# Patient Record
Sex: Male | Born: 1970 | Race: White | Hispanic: No | Marital: Married | Smoking: Never smoker
Health system: Southern US, Community
[De-identification: ages and names within clinical notes are randomized; demographics above are authoritative.]

## PROBLEM LIST (undated history)

## (undated) HISTORY — PX: WISDOM TOOTH EXTRACTION: SHX21

---

## 2015-03-01 ENCOUNTER — Encounter: Payer: Self-pay | Admitting: Family Medicine

## 2015-03-01 ENCOUNTER — Ambulatory Visit
Admission: RE | Admit: 2015-03-01 | Discharge: 2015-03-01 | Disposition: A | Discharge status: Home / Self Care | Payer: BC Managed Care – PPO | Source: Ambulatory Visit | Attending: Family Medicine | Admitting: Family Medicine

## 2015-03-01 ENCOUNTER — Ambulatory Visit (INDEPENDENT_AMBULATORY_CARE_PROVIDER_SITE_OTHER): Payer: BC Managed Care – PPO | Admitting: Family Medicine

## 2015-03-01 VITALS — BP 129/85 | HR 51 | Ht 72.0 in | Wt 177.0 lb

## 2015-03-01 DIAGNOSIS — R1030 Lower abdominal pain, unspecified: Secondary | ICD-10-CM | POA: Diagnosis not present

## 2015-03-01 DIAGNOSIS — R1032 Left lower quadrant pain: Secondary | ICD-10-CM

## 2015-03-01 DIAGNOSIS — R29898 Other symptoms and signs involving the musculoskeletal system: Secondary | ICD-10-CM

## 2015-03-01 NOTE — Progress Notes (Signed)
Jack Huynh - 45 y.o. 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 He states he ran 1700-1800 miles in the last year.  He noticed left groin pain during the [facility] in September. The pain now radiates from his groin, specifically medially extending to the ischial tuberosity, distally to his anterio medial quad. No bruising or swelling.   He had just changed to [facility] prior to the injury.  Since then he has tried various treatment modalities with minimal improvement in his pain. He has had acupuncture by PT, personally rolling out various muscle groups, and various adjustments by his chiropractor, who believes he has L4 radiculopathy.  [place] in [place] interim before taking the last 5 weeks off (other than 2x/week elliptical and spin, neither of which caused much pain).  He is medically healthy with no significant LE injuries in the past.  No systemic symptoms. No bowel/bladder dysfunction. LLE weakness. NO pain radiating past the anteriomedial thigh.   ROS:    As above, including no fevers, chills, night sweats, weight loss. No visible joint swelling or skin rashes.   HISTORY: Past Medical, Surgical, Social, and Family History Reviewed & Updated per EMR.  Pertinent Historical Findings include: None. Never smoker.  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   Outside XR from Chiropracter from 12/12/2014 shows Grade I/II anterolisthsis of L5/S1.  Otherwise mild arthritic change.   OBJECTIVE: BP 129/85 mmHg  Pulse 51  Ht 6' (1.829 m)  Wt 177 lb (80.287 kg)  BMI 24.00 kg/m2  Physical Exam  Calm, NAD Non-labored breathing No rash  Back/pelvis/LLE Exam: Inspection: unremarkable Motion: full SLR seated:  neg                 SLR lying: neg Seated HS Flexibility:  poor Palpable tenderness: along adductors/hamstrings, especially the ischial tuberosity. NO greater troch or SI tenderness. NO lumbar spinous process tenderness.  FABER: negative Negative piriformis stretch Sensory change: none Reflex change: none Strength at foot Plantar-flexion: 5 / 5    Dorsi-flexion: 5 / 5    Eversion: 5 / 5   Inversion: 5 / 5 Leg strength Quad:  4+ on the left compared to the right 5/ 5   Hamstring: 4+ on the left compared to the right 5/ 5    Hip flexor: 4+ on the left compared to the right 5/ 5   Hip abductors:4+ on the left compared to the right 5/ 5. Pain with resisted abd and adduction located primarily in the groin.  Gait Walking:  Non-antalgic       Heels: wnl     Toes:   wnl       Negative hop test on the left  Negative fulcrum test on the left   U/s: long and short axis views of the posteriomedial hip were obtained.  There does appear to be slightly increased hypoechoic change on the left vs. the right in addition to mild increased vascularity.   MEDICATIONS, LABS & OTHER ORDERS: Previous Medications   No medications on file   Modified Medications   No medications on file   New Prescriptions   No medications on file   Discontinued Medications   No medications on file  No orders of the defined types were placed  in this encounter.   ASSESSMENT & PLAN: LLE weakness and pain x 4 months non responsive to rest, multiple treatment modalities.  No red flags.  Multiple muscle groups appear involved likely compensating for primary injury.  Most concerned about stress fracture, Pelvic more likely than femoral neck, or avulsion injury. Will get XRs 1st.  If negative we may need to extend evaluation to the lumbar spine.  Call with any questions and we will discuss f/u after MRI.  Advised against working out until we know the cause due to risk of severe traumatic injury.   40 minutes of face to face time, more than half of which was spent discussing various  diagnoses and potential recovery time.

## 2015-03-18 ENCOUNTER — Telehealth: Payer: Self-pay | Admitting: Family Medicine

## 2015-03-18 DIAGNOSIS — M7918 Myalgia, other site: Secondary | ICD-10-CM

## 2015-03-18 NOTE — Telephone Encounter (Signed)
Pt was told to call her to leave a msg for Dr neal:  Didn't do MRI that was scheduled 2 weeks ago. Would like to talk to dr neal before rescheduling MRI.

## 2015-03-19 NOTE — Telephone Encounter (Signed)
Faxed office notes and PT Rx to: Charlsie Merles, [facility] Fax: [phone] Phone: [phone]

## 2015-03-19 NOTE — Telephone Encounter (Signed)
Will forward to Dr. Neal. Letesha Klecker,CMA  

## 2015-03-19 NOTE — Telephone Encounter (Signed)
[phone] (H) Called pt--voice mail was "unable to process my call at this time" I will try to call back later Jack Huynh

## 2015-03-19 NOTE — Telephone Encounter (Signed)
Spoke with patient at length regarding his issues and the next step in evaluation. The MRIs probably going to cost him about $1200. We discussed and he decided to give an evaluation by a friend of his was also a physical therapist and a runner. I will send another referral and send a copy of my note to Charlsie Merles in [place]. After evaluation, Jack Huynh will call me and let me know where we stand. I think this is a very reasonable approach.

## 2015-03-19 NOTE — Telephone Encounter (Signed)
Neeton This is for you at [facility]  Tia This referral is NOT for [facility]

## 2015-04-19 ENCOUNTER — Telehealth: Payer: Self-pay | Admitting: *Deleted

## 2015-04-19 NOTE — Telephone Encounter (Signed)
-----   Message from Lizbeth BarkMelanie L Ceresi sent at 04/18/2015  9:36 AM EDT ----- Regarding: phone message Contact: [phone] Pt called stating she wants to talk to Dr. Jennette KettleNeal regarding his next step of plan.

## 2015-04-19 NOTE — Telephone Encounter (Signed)
Let me know what you suggest for this patient, should we just make office visit to discuss?

## 2015-04-23 NOTE — Telephone Encounter (Signed)
Spoke to the patient and he would prefer a callback from you to go over his concerns from PT and if he would need an MRI.  Since he lives in [place] he said a phonecall is much easier than a visit. Make sure to dial his areacode since its a long distance number

## 2015-04-23 NOTE — Telephone Encounter (Signed)
Rhea Yes see if we can get him in to see me or one of the fellows---I cannot remember which one he saw with me last time---maybe Harrison MonsBlake? I think it is going to be too complicated over phone but of course if he WANTS me to call and try I can THANKS! Denny LevySara Neal

## 2015-04-24 ENCOUNTER — Telehealth: Payer: Self-pay | Admitting: Family Medicine

## 2015-04-24 NOTE — Telephone Encounter (Signed)
[phone] (H) Left mssg I wil try to call him back later todayor tomorrow AM Denny LevySara Haliegh Khurana

## 2015-04-25 NOTE — Telephone Encounter (Signed)
Long discussion regarding his pain. He is now talking about some back pain that sometimes radiates down his thigh. Even after asking him several times and not quite sure if it radiates down the posterior part oriented part. He also still has the groin pain. He is only run twice since we saw him. He has been going to physical therapist. The physical therapist told him he thought he may have an L4 nerve problem. He has been reading some stuff on the Internet and is wondering if he has a sports hernia or "muscle splintering" which I think is probably a muscle strain. I'm not sure of that definition.  We talked for a very long time. [place] works in [place] so coming back in for an appointment is a bit difficult but he finally agreed to do that so we can get a better handle on this. I'm going to ask him to be seen by Dr. Darrick PennaFields. We will call him to make that appointment. He will be out of town the next few days with return next Tuesday night so he would be available for an appointment next Wednesday the 29th.

## 2015-05-08 ENCOUNTER — Ambulatory Visit: Payer: BC Managed Care – PPO | Admitting: Sports Medicine

## 2015-05-22 ENCOUNTER — Ambulatory Visit
Admission: RE | Admit: 2015-05-22 | Discharge: 2015-05-22 | Disposition: A | Discharge status: Home / Self Care | Payer: BC Managed Care – PPO | Source: Ambulatory Visit | Attending: Sports Medicine | Admitting: Sports Medicine

## 2015-05-22 ENCOUNTER — Ambulatory Visit (INDEPENDENT_AMBULATORY_CARE_PROVIDER_SITE_OTHER): Payer: BC Managed Care – PPO | Admitting: Sports Medicine

## 2015-05-22 ENCOUNTER — Encounter: Payer: Self-pay | Admitting: Sports Medicine

## 2015-05-22 ENCOUNTER — Other Ambulatory Visit: Payer: BC Managed Care – PPO

## 2015-05-22 VITALS — BP 120/70 | Ht 72.0 in | Wt 177.0 lb

## 2015-05-22 DIAGNOSIS — R1030 Lower abdominal pain, unspecified: Secondary | ICD-10-CM | POA: Diagnosis not present

## 2015-05-22 DIAGNOSIS — R29898 Other symptoms and signs involving the musculoskeletal system: Secondary | ICD-10-CM

## 2015-05-22 DIAGNOSIS — M545 Low back pain, unspecified: Secondary | ICD-10-CM

## 2015-05-22 DIAGNOSIS — R1032 Left lower quadrant pain: Secondary | ICD-10-CM

## 2015-05-22 DIAGNOSIS — G8929 Other chronic pain: Secondary | ICD-10-CM

## 2015-05-22 NOTE — Progress Notes (Signed)
Patient ID: Jack Huynh, male   DOB: April 08, 1970, 45 y.o.   MRN: [id number]  CC: persistent left groin pain and left leg weakness  Last year the patient ran more intensely than before He completed marathons in March April and November He also did the [employer] relay race in October By the first week in October just after the relay race he was having some groin pain He continued training with long runs up to 18 miles and the groin pain persisted  In November he completed the [employer] [place] in 3 hours and 29 minutes He felt like he ran about 10 minutes slower because of his left thigh seeming weaker  Since first the year he has tried running 4-5 times but consistently gets pain  He has seen a chiropractor and has done some physical therapy to try to improve strength around the left hip This has not been helpful Chiropractor was concerned that he might have some L4 radiculopathy  He was seen in our office with concern about a femoral neck stress fracture Regular x-rays were unremarkable He declined to get the MRI of the hip as he felt that this did not seem primarily in the hip  Social history: Surveyor, quantitysuperintendent of schools in [place] Non smoker  ROS He has pain and sometimes some spasm over his lower abdomen above the symphysis pubis He has pain with cough and sneezing but this is located at the symphysis No bulging in either the testicle or groin area  Physical examination Muscular white male in no acute distress BP 120/70 mmHg  Ht 6' (1.829 m)  Wt 177 lb (80.287 kg)  BMI 24.00 kg/m2  Full hip range of motion without any pain normal SI joint motion Tenderness to palpation and percussion over the symphysis pubis No bulging noted in the femoral triangle or lower abdomen with Valsalva  There is atrophy of the left measuring 2 cm smaller than the right The left calf this three-quarter centimeter smaller than the right  Strength of hip flexors, hip abductors,  quadriceps and hamstrings are all normal Hip adductor on the left is weak and painful to testing  Neurologic signs - only positive noted is that he has slightly diminished reflex at the left knee He can do heel, toe and tandem walk Straight leg raise is negative  Review of x-rays - left hip films look entirely normal  Review of symphysis on x-ray shows sclerosis on the left, some widening at the inferior symphysis and some possible cystic change

## 2015-05-22 NOTE — Assessment & Plan Note (Addendum)
Her we'll work hip motion  If MRI does not suggest another cause will lead stretches and other standard treatment for osteitis pubis  I spent 45 minutes face-to-face evaluated in reviewing all of this patient's data. Greater 50% of this was spent counseling regarding diagnostic possibilities and possible treatments.

## 2015-05-22 NOTE — Assessment & Plan Note (Signed)
I gave him a series of exercises to emphasize his abductor and the other muscles around the hip girdle  We will continue with these and he'll get more information from his MRI  If we think he has lumbar disease we may need to add a medication like gabapentin

## 2015-05-27 ENCOUNTER — Ambulatory Visit (INDEPENDENT_AMBULATORY_CARE_PROVIDER_SITE_OTHER): Payer: BC Managed Care – PPO | Admitting: Sports Medicine

## 2015-05-27 ENCOUNTER — Encounter: Payer: Self-pay | Admitting: Sports Medicine

## 2015-05-27 VITALS — Ht 72.0 in | Wt 177.0 lb

## 2015-05-27 DIAGNOSIS — M4316 Spondylolisthesis, lumbar region: Secondary | ICD-10-CM

## 2015-05-28 NOTE — Progress Notes (Signed)
Patient ID: Jack Huynh, male   DOB: [birth year], 45 y.o.   MRN: [id number]  Patient comes in today to discuss MRI findings of his lumbar spine. I see him in lieu of Dr Darrick PennaFields' absence. The MRI shows single level pathology at L4-L5. 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 Remainder of his lumbar spine is fairly unremarkable. We feel like this finding is responsible for the patient's left leg weakness and pain. I've discussed this case thoroughly with Dr.Fields. We would like for the patient to start with several Williams flexion home exercises. Patient would also like to work with a physical therapist in [place] so I will arrange for that as well. I had a long talk with him regarding his diagnosis, prognosis, and activity level. He is instructed to avoid any and all activities that involve lumbar hyperextension. We also think that he needs to avoid running at least until follow-up. He will follow-up with Dr. Darrick PennaFields again in 6 weeks.  Total time spent with this patient was 30 minutes with greater than 50% of the time spent in face-to-face consultation discussing his diagnosis, reviewing his MRI, and answering questions about level of activity allowed and prognosis.  Addendum: At the time of his office visit, the patient was asking about a possible "nerve block". He stated that Dr.Fields had mentioned this to him. I discussed this with Dr. Darrick PennaFields who told me that the patient misunderstood and his suggestion was actually to start gabapentin. I left a message on the patient's voicemail relaying this information and asking the patient to contact me if he would like for me to start him on this medication.

## 2015-07-11 ENCOUNTER — Encounter: Payer: Self-pay | Admitting: Sports Medicine

## 2015-07-11 ENCOUNTER — Ambulatory Visit (INDEPENDENT_AMBULATORY_CARE_PROVIDER_SITE_OTHER): Payer: BC Managed Care – PPO | Admitting: Sports Medicine

## 2015-07-11 VITALS — BP 116/71 | HR 47 | Ht 72.0 in | Wt 170.0 lb

## 2015-07-11 DIAGNOSIS — M4316 Spondylolisthesis, lumbar region: Secondary | ICD-10-CM | POA: Diagnosis not present

## 2015-07-11 DIAGNOSIS — R29898 Other symptoms and signs involving the musculoskeletal system: Secondary | ICD-10-CM | POA: Diagnosis not present

## 2015-07-11 MED ORDER — GABAPENTIN 300 MG PO CAPS: 300.0000 mg | ORAL_CAPSULE | Freq: Every day | ORAL | Status: DC

## 2015-07-11 NOTE — Progress Notes (Signed)
  Subjective:   Jack Huynh is a 45 y.o. male with a history of spondylolisthesis at L4-L5 with leg pain and weakness here for f/u of L pain.  [facility], patient reports that pain has improved somewhat, but plateaued.  Pain is in medial and anterior upper thigh and worse with standing in place.  He worked with PT and now does core exercises and walks 1 mile 4d/wk and spin class once a week. Exercises that PT instructed him to do include front planks with leg lifts, bird-dogs, and other core exercises.  He has stopepd doing yoga and overhead lifting.  He is also concerned because his wife noted a bulge in his lower hamstring of L leg 4-6 wks ago.  It is only present with hamstring tightening.  It has not been tender, but he occasionally notices a fullness and dull ache in that area with exercise.  Many months ago, he did have a feeling of tightness in that hamstring while running.   Review of Systems:  Per HPI. Denies any difficulty walking, fever, chills, erythema  Social History: never smoker  Objective:  BP 116/71 mmHg  Pulse 47  Ht 6' (1.829 m)  Wt 170 lb (77.111 kg)  BMI 23.05 kg/m2  Gen:  45 y.o. male in NAD MSK: L Hip: Intact ROM, strength 5/5 except hip flexion 4+/5 and hip abduction 4/5. Neutral gait without trendelenburg, no sensory changes Back: No TTP, pain with back extension on planted leg with radiation of pain into L thigh  Note left thigh is now only 1 cm smaller than RT   US: small bursal swelling at gastroc and semimembranosus tendons  Fascial bulge of L hamstring, normal hamstring tendon and bulk,  No tear noted Normal vessels      Assessment & Plan:     Jack Huynh is a 45 y.o. male here for   Spondylolisthesis at L4-L5 level Continues to have pain related to spondylolisthesis Advised patient on appropriate and inappropriate exercises for strengthening core (see AVS, avoid back extension) OK to slowly ease back into running  - avoid downhills Start gabapentin for symptom control at 300mg  qhs and increase to 600mg  qhs if tolerable and additional relief needed after 1-2 weeks F/u in 2 months     Erasmo DownerAngela M Bacigalupo, MD MPH PGY-2,  [facility] 07/11/2015  10:59 AM    Agree with assessment and plan

## 2015-07-11 NOTE — Assessment & Plan Note (Signed)
Continues to have pain related to spondylolisthesis Advised patient on appropriate and inappropriate exercises for strengthening core (see AVS, avoid back extension) OK to slowly ease back into running - avoid downhills Start gabapentin for symptom control at 300mg  qhs and increase to 600mg  qhs if tolerable and additional relief needed after 1-2 weeks F/u in 2 months

## 2015-07-11 NOTE — Assessment & Plan Note (Signed)
This is improving with PT   Keep up hip and leg exercises

## 2015-07-11 NOTE — Patient Instructions (Addendum)
Abdominal sit ups in sets of 15 - work up to 30 daily Abdominal crunches in sets of 15 - work up to 100 daily  Use hamstring exercise handout to do extender and diver exercises  Can do any weight exercises or other things that feel comfortable as long as no back extension  Stop bird-dogs and forward planks  Seated or standing deadlift to vertical can be done instead  Start taking gabapentin 300mg  daily at bedtime.  After 1-2 weeks if not having enough relief of pain, increase to 600mg  daily at bedtime.  Hold and let us know if you are having mood changes or excessive daytime sleepiness.

## 2015-09-04 ENCOUNTER — Encounter: Payer: Self-pay | Admitting: Sports Medicine

## 2015-09-04 ENCOUNTER — Ambulatory Visit (INDEPENDENT_AMBULATORY_CARE_PROVIDER_SITE_OTHER): Payer: BC Managed Care – PPO | Admitting: Sports Medicine

## 2015-09-04 DIAGNOSIS — M4316 Spondylolisthesis, lumbar region: Secondary | ICD-10-CM

## 2015-09-04 NOTE — Progress Notes (Signed)
  Jack Huynh - 45 y.o. male MRN [id number]  Date of birth: 1970/10/24  SUBJECTIVE:  Including CC & ROS.  Chief Complaint  Patient presents with  . Hip Pain    Jack Huynh is a 45 yo male that is presenting for right thigh pain and new acute low back pain. He was last seen in June and has been diagnosed with a spondylolithesis. His symptoms have been present since September of last year. He has performed his own home physical therapy but hasn't taken any gabapentin. He has quit running as of December and reports he ran 1600 miles last year.  He reports no change in his pain. He is also having pain in his groin and anterior thigh.   His left low back pain started two weeks ago. He was golfing recently and that aggravated the pain. He has no symptoms on the right side. He reports he had a broken vertebrae when he was in the 4th grade.   ROS: No unexpected weight loss, fever, chills, swelling, instability, muscle pain, numbness/tingling, redness, otherwise see HPI   HISTORY: Past Medical, Surgical, Social, and Family History Reviewed & Updated per EMR.   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 Endplate marrow changes with some edema that could be associated with back pain. Foraminal stenosis bilaterally that could compress either or both L4 nerve roots.    PHYSICAL EXAM:  VS: BP:119/73  HR: bpm  TEMP: ( )  RESP:   HT:6' (182.9 cm)   WT:170 lb (77.1 kg)  BMI:23.1 PHYSICAL EXAM: Gen: NAD, alert, cooperative with exam, well-appearing HEENT: clear conjunctiva,  CV:  no edema, capillary refill brisk,  Resp: non-labored Skin: no rashes, normal turgor  Neuro: no gross deficits.  Psych:  alert and oriented Back Exam:  Inspection: Unremarkable  Palpable tenderness:  None. Slightly weaker with hip flexion on left than right  Hip abduction equal bilaterally  Strength at foot: Plantar-flexion: 5/5 Dorsi-flexion: 5/5 Eversion: 5/5 Inversion: 5/5  Sensory change: Gross sensation intact to all lumbar and sacral dermatomes.  Gait unremarkable. Neurovascularly intact   ASSESSMENT & PLAN:   I spent 30 minutes with this patient, greater than 50% was face-to-face time counseling regarding the below diagnosis.   Spondylolisthesis at L4-L5 level Most likely his pain is stemming from his pathology observed on MRI. His slippage occurred while he was in his race at the end of last year and was pushing his pace while running down hill.  - provided counseling in regards to gabapentin and its use. He agrees to take it.  - counseled on exercises and encouraged bike use with no downhill running and limit certain swimming techniques.  - advised to follow up in two months.

## 2015-09-04 NOTE — Assessment & Plan Note (Signed)
Most likely his pain is stemming from his pathology observed on MRI. His slippage occurred while he was in his race at the end of last year and was pushing his pace while running down hill.  - provided counseling in regards to gabapentin and its use. He agrees to take it.  - counseled on exercises and encouraged bike use with no downhill running and limit certain swimming techniques.  - advised to follow up in two months.

## 2017-02-18 ENCOUNTER — Encounter: Payer: Self-pay | Admitting: Sports Medicine

## 2017-02-18 ENCOUNTER — Other Ambulatory Visit: Payer: BC Managed Care – PPO

## 2017-02-18 ENCOUNTER — Ambulatory Visit (INDEPENDENT_AMBULATORY_CARE_PROVIDER_SITE_OTHER): Payer: BC Managed Care – PPO | Admitting: Sports Medicine

## 2017-02-18 VITALS — BP 112/80 | Ht 72.0 in | Wt 183.0 lb

## 2017-02-18 DIAGNOSIS — M4316 Spondylolisthesis, lumbar region: Secondary | ICD-10-CM | POA: Diagnosis not present

## 2017-02-18 NOTE — Progress Notes (Signed)
   Subjective:    Patient ID: Jack Huynh, male    DOB: May 31, 1970, 47 y.o.   MRN: [id number]  CC: spondylolisthesis follow up  HPI  Jack Romansravis Skelton is a 47 yo male who presents for a follow up of spondylolisthesis (L4-L5 vertebrae).    He was last seen in clinic on 09/04/2015. He was prescribed gabapentin for his pain at that time and flexion exercises were discussed.   He returns today due to worsening pain.  He states that his pain is worse on extension of his back and running downhill.  His activity has been somewhat more limited (no longer able to play golf). He stopped taking his Gabapentin after 1 month because he felt like it didn't help his pain and he "doesn't like to take medication regularly." He continues to run regularly, running 11 miles yesterday.  He has tried PT and acupuncture and they haven't helped.  No alleviating factors.  Note pain is progressively worse last several months.  Chiropractic eval suggested more slip and not a candidate.  [place] who recommended AP fusion   Review of Systems Sciatic sxs intermittently These have affected both Lower extremes Feels that left leg at times is weaker Pertinent positives as given in HPI.     Objective:   Physical Exam   Vitals:   02/18/17 1440  BP: 112/80   General: alert, interactive. No acute distress HEENT: normocephalic, atraumatic. Sclera white. Moist mucus membranes Skin: no rashes, lesions visible Neuro: no gross focal deficits  MSK: Positive "flamingo test" on right side.   5/5 strength of hamstrings, hip abductors, gastrocnemius bilaterally. Inversion, eversion dorsiflexion and plantar flexion strong bilat GRT toe ext/ flexion strong  Toe walk and heel walk without difficultuy     Assessment & Plan:    47 yo male who presents for a follow up of spondylolisthesis (L4-L5 vertebrae). Pain has worsened over past 1.5 years and alternative treatments have not alleviated pain.  After  discussion with patient, states that he does not want to take medication chronically.  Interested in follow up MRI and possibly pursuing surgery via fusion of vertebrae.   Spondylolisthesis (L4-L5 vertebrae) - MRI of lumbar spine - Referral made to neurosurgery  [facility] PGY[facility]3  I observed and examined the patient with the resident and agree with assessment and plan.  Note reviewed and modified by me. Sterling BigKB Fields, MD

## 2017-02-18 NOTE — Assessment & Plan Note (Signed)
In discussion today I advised: Reassess MRI NS consult My opinion is that he is a good candidate for surgery

## 2017-02-23 ENCOUNTER — Other Ambulatory Visit: Payer: BC Managed Care – PPO

## 2017-03-11 ENCOUNTER — Other Ambulatory Visit: Payer: Self-pay

## 2017-03-11 DIAGNOSIS — M4316 Spondylolisthesis, lumbar region: Secondary | ICD-10-CM

## 2017-04-21 ENCOUNTER — Other Ambulatory Visit: Payer: Self-pay | Admitting: Neurosurgery

## 2017-04-28 ENCOUNTER — Other Ambulatory Visit: Payer: Self-pay | Admitting: Neurosurgery

## 2017-05-07 ENCOUNTER — Ambulatory Visit
Admission: RE | Admit: 2017-05-07 | Discharge: 2017-05-07 | Disposition: A | Discharge status: Home / Self Care | Payer: BC Managed Care – PPO | Source: Ambulatory Visit | Attending: Sports Medicine | Admitting: Sports Medicine

## 2017-05-07 DIAGNOSIS — M4316 Spondylolisthesis, lumbar region: Secondary | ICD-10-CM

## 2017-05-22 NOTE — H&P (Signed)
Patient ID:   [id number] Patient: Jack Huynh  Date of Birth: 24-Jul-1970 Visit Type: Office Visit   Date: 04/21/2017 09:15 AM Provider: Danae OrleansJoseph D. Venetia MaxonStern MD   This 47 year old male presents for back pain.  HISTORY OF PRESENT ILLNESS:  1.  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  Patient notes left hip and hamstring pain since 2015. He began training for marathon in 2014. Symptoms increased significantly September 2016 to include left groin, left hamstring, with left footdrop and left leg weakness when running.  Recently, patient notes new bilateral lumbar pain.  Gabapentin stopped after 1 month due to potential side effects  Chiropractic 2015 -2017. Chiropractor declined to perform adjustments after reviewing most recent x-ray and MRI Physical therapy caused increased pain 2016, no significant relief 2017 Acupuncture offering no significant relief 2017  History:  Healthy.  Vertebral fracture in 4th grade treated with rest.  Surgical history:  None  MRI 2017 on [facility] X-rays on [facility]  The patient describes that he has always in pain and his pain is generally 5/10.  He says that he is in pain at least 78% of the time.  He describes his low back is more bothersome than his legs but that his leg pain is up to 7 to 8/10 in severity.  He also notes a left footdrop and that this is interfering with his ability to run.  He says he generally runs about 5 6 miles but has had to cut back significantly because of the significant amount of pain he is in.   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  On flexion x-rays there is 19 mm of anterolisthesis of L4 on L5 which decreases to 15.6 mm on extension and increases to 17 point not 1 mm on neutral lateral radiograph.           PAST MEDICAL/SURGICAL HISTORY   (Detailed) Patient reported no relevant past medical/surgical  history.      Family History:  (Detailed) Patient reports there is no relevant family history.     Social History:  (Detailed) Tobacco use reviewed. Preferred language is [place].   Tobacco use status: Current non-smoker. Smoking status: Never smoker.  SMOKING STATUS Type Smoking Status Usage Per Day Years Used Total Pack Years   Never smoker          MEDICATIONS: (added, continued or stopped this visit)   ALLERGIES: Ingredient Reaction Medication Name Comment  NO KNOWN ALLERGIES     No known allergies.   REVIEW OF SYSTEMS   See scanned patient registration form, dated, signed and dated on   Review of Systems Details System Neg/Pos Details  Constitutional Negative Chills, Fatigue, Fever, Malaise, Night sweats, Weight gain and Weight loss.  ENMT Negative Ear drainage, Hearing loss, Nasal drainage, Otalgia, Sinus pressure and Sore throat.  Eyes Negative Eye discharge, Eye pain and Vision changes.  Respiratory Negative Chronic cough, Cough, Dyspnea, Known TB exposure and Wheezing.  Cardio Negative Chest pain, Claudication, Edema and Irregular heartbeat/palpitations.  GI Negative Abdominal pain, Blood in stool, Change in stool pattern, Constipation, Decreased appetite, Diarrhea, Heartburn, Nausea and Vomiting.  GU Negative Dribbling, Dysuria, Erectile dysfunction, Hematuria, Polyuria (Genitourinary), Slow stream, Urinary frequency, Urinary incontinence and Urinary retention.  Endocrine Negative Cold intolerance, Heat intolerance, Polydipsia and Polyphagia.  Neuro Positive Extremity weakness.  Psych Negative Anxiety, Depression and Insomnia.  Integumentary Negative Brittle hair, Brittle nails, Change  in shape/size of mole(s), Hair loss, Hirsutism, Hives, Pruritus, Rash and Skin lesion.  MS Positive Back pain.  Hema/Lymph Negative Easy bleeding, Easy bruising and Lymphadenopathy.  Allergic/Immuno Negative Contact allergy, Environmental allergies, Food allergies and  Seasonal allergies.  Reproductive Negative Penile discharge and Sexual dysfunction.   PHYSICAL EXAM:   Vitals Date Temp F BP Pulse Ht In Wt Lb BMI BSA Pain Score  04/21/2017  115/74 47 72 186.8 25.33  5/10    PHYSICAL EXAM Details General Level of Distress: no acute distress Overall Appearance: normal  Head and Face  Right Left  Fundoscopic Exam:  normal normal    Cardiovascular Cardiac: regular rate and rhythm without murmur  Right Left  Carotid Pulses: normal normal  Respiratory Lungs: clear to auscultation  Neurological Orientation: normal Recent and Remote Memory: normal Attention Span and Concentration:   normal Language: normal Fund of Knowledge: normal  Right Left Sensation: normal normal Upper Extremity Coordination: normal normal  Lower Extremity Coordination: normal normal  Musculoskeletal Gait and Station: normal  Right Left Upper Extremity Muscle Strength: normal normal Lower Extremity Muscle Strength: normal normal Upper Extremity Muscle Tone:  normal normal Lower Extremity Muscle Tone: normal normal   Motor Strength Upper and lower extremity motor strength was tested in the clinically pertinent muscles. Any abnormal findings will be noted below.   Right Left Tib Anterior:  4+/5 EHL: 4/5 4/5   Deep Tendon Reflexes  Right Left Biceps: normal normal Triceps: normal normal Brachioradialis: normal normal Patellar: normal normal Achilles: normal normal  Sensory Sensation was tested at L1 to S1. Any abnormal findings will be noted below.  Right Left L5:  decreased   Cranial Nerves II. Optic Nerve/Visual Fields: normal III. Oculomotor: normal IV. Trochlear: normal V. Trigeminal: normal VI. Abducens: normal VII. Facial: normal VIII. Acoustic/Vestibular: normal IX. Glossopharyngeal: normal X. Vagus: normal XI. Spinal Accessory: normal XII. Hypoglossal: normal  Motor and other  Tests Lhermittes: negative Rhomberg: negative Pronator drift: absent     Right Left Hoffman's: normal normal Clonus: normal normal Babinski: normal normal SLR: negative positive at 60 degrees Patrick's Pearlean Brownie): negative negative Toe Walk: normal normal Toe Lift: normal normal Heel Walk: normal normal SI Joint: nontender nontender   Additional Findings:  Hip adductor left 4/5     IMPRESSION:   The patient has a grade 2 spondylolisthesis of L4 on L5 with spondylolysis of L4.  I have recommended surgical treatment for this given his lower extremity weakness and the severity of his imaging findings.  He is at a point where conservative management is no longer giving him significant relief.  Surgery will consist of L4 Gill procedure with L4 through L5 decompression and fusion with interbody grafting and pedicle screw fixation.   PLAN:  While the patient was quite anxious an asked many questions (1 hr of direct patient care was performed in discussing situation, examining the patient and reviewing imaging findings as well as surgical planning), the patient does feel as he is at a point where he what's to go ahead with surgery as he is not getting any better and this is starting to interfere with the quality of his life.  He is fitted for LSO brace.  Risks and benefits of surgery were discussed in detail with the patient who wished to proceed.  This has been set up for Jun 03, 2017 at [facility].     Orders: Diagnostic Procedures: Assessment Procedure  M54.16 Lumbar Spine- AP/Lat  M54.16 Lumbar Spine- AP/Lat/Obls/Spot/Flex/Ex  Instruction(s)/Education:  Assessment Instruction  [phone] Dietary management education, guidance, and counseling  Miscellaneous: Assessment   M43.16 Aspen Lo Sag Rigid Panel Quick   Completed Orders (this encounter) Order Details Reason Side Interpretation Result Initial Treatment Date Region  Lumbar Spine- AP/Lat/Obls/Spot/Flex/Ex      04/21/2017 All  Levels to All Levels  Dietary management education, guidance, and counseling patient encouraged to eat a well balanced diet         Assessment/Plan   # Detail Type Description   1. Assessment Spondylolysis (M43.00).       2. Assessment Disc displacement, lumbar (M51.26).       3. Assessment Low back pain, unspecified back pain laterality, with sciatica presence unspecified (M54.5).       4. Assessment Lumbar foraminal stenosis (M99.83).       5. Assessment Spondylolisthesis of lumbar region (M43.16).   Plan Orders Aspen Lo Sag Rigid Panel Quick.       6. Assessment Lumbar radiculopathy (M54.16).       7. Assessment Body mass index (BMI) 25.0-25.9, adult (Y78.29).   Plan Orders Today's instructions / counseling include(s) Dietary management education, guidance, and counseling.         Pain Management Plan Pain Scale: 5/10. Method: Numeric Pain Intensity Scale. Location: back. Onset: 10/23/2014. Duration: varies. Quality: discomforting. Pain management follow-up plan of care: Patient is taking OTC pain relievers for relief..              Provider:  Venetia Maxon MD, Danae Orleans 04/24/2017 2:58 PM  Dictation edited by: Danae Orleans. Venetia Maxon    CC Providers: Enid Baas [facility] [address] [place],  [place]  [address]-   Enid Baas  [facility] [place] [address] [place], [place] [address]-              Electronically signed by Danae Orleans. Venetia Maxon MD on 04/24/2017 02:58 PM

## 2017-05-27 NOTE — Pre-Procedure Instructions (Signed)
Fredrich Romansravis Giese  05/27/2017      [facility] [id number] [place] - [address] [facility] [address] [place] [id number] Phone: [phone] Fax: [phone]    Your procedure is scheduled on Thursday May 2.  [facility].M.  Call this number if you have problems the morning of surgery:  [phone]   Remember:  Do not eat food or drink liquids after midnight.  Take these medicines the morning of surgery with A SIP OF WATER: NONE  7 days prior to surgery STOP taking any Aspirin(unless otherwise instructed by your surgeon), Aleve, Naproxen, Ibuprofen, Motrin, Advil, Goody's, BC's, all herbal medications, fish oil, and all vitamins    Do not wear jewelry, make-up or nail polish.  Do not wear lotions, powders, or perfumes, or deodorant.  Do not shave 48 hours prior to surgery.  Men may shave face and neck.  Do not bring valuables to the hospital.  [facility] is not responsible for any belongings or valuables.  Contacts, dentures or bridgework may not be worn into surgery.  Leave your suitcase in the car.  After surgery it may be brought to your room.  For patients admitted to the hospital, discharge time will be determined by your treatment team.  Patients discharged the day of surgery will not be allowed to drive home.   Special instructions:    [facility]- Preparing For Surgery  Before surgery, you can play an important role. Because skin is not sterile, your skin needs to be as free of germs as possible. You can reduce the number of germs on your skin by washing with CHG (chlorahexidine gluconate) Soap before surgery.  CHG is an antiseptic cleaner which kills germs and bonds with the skin to continue killing germs even after washing.  Please do not use if you have an allergy to CHG or antibacterial soaps. If your skin becomes reddened/irritated stop using the CHG.  Do not shave  (including legs and underarms) for at least 48 hours prior to first CHG shower. It is OK to shave your face.  Please follow these instructions carefully.   1. Shower the NIGHT BEFORE SURGERY and the MORNING OF SURGERY with CHG.   2. If you chose to wash your hair, wash your hair first as usual with your normal shampoo.  3. After you shampoo, rinse your hair and body thoroughly to remove the shampoo.  4. Use CHG as you would any other liquid soap. You can apply CHG directly to the skin and wash gently with a scrungie or a clean washcloth.   5. Apply the CHG Soap to your body ONLY FROM THE NECK DOWN.  Do not use on open wounds or open sores. Avoid contact with your eyes, ears, mouth and genitals (private parts). Wash Face and genitals (private parts)  with your normal soap.  6. Wash thoroughly, paying special attention to the area where your surgery will be performed.  7. Thoroughly rinse your body with warm water from the neck down.  8. DO NOT shower/wash with your normal soap after using and rinsing off the CHG Soap.  9. Pat yourself dry with a CLEAN TOWEL.  10. Wear CLEAN PAJAMAS to bed the night before surgery, wear comfortable clothes the morning of surgery  11. Place CLEAN SHEETS on your bed the night of your first shower and DO NOT SLEEP WITH PETS.  Day of Surgery: Do not apply any deodorants/lotions. Please wear clean clothes to the hospital/surgery center.      Please read over the following fact sheets that you were given. Coughing and Deep Breathing, MRSA Information and Surgical Site Infection Prevention

## 2017-05-28 ENCOUNTER — Encounter (HOSPITAL_COMMUNITY)
Admission: RE | Admit: 2017-05-28 | Discharge: 2017-05-28 | Disposition: A | Discharge status: Home / Self Care | Payer: BC Managed Care – PPO | Source: Ambulatory Visit | Attending: Neurosurgery | Admitting: Neurosurgery

## 2017-05-28 ENCOUNTER — Other Ambulatory Visit: Payer: Self-pay

## 2017-05-28 ENCOUNTER — Encounter (HOSPITAL_COMMUNITY): Payer: Self-pay

## 2017-05-28 DIAGNOSIS — Z01812 Encounter for preprocedural laboratory examination: Secondary | ICD-10-CM | POA: Diagnosis present

## 2017-05-28 DIAGNOSIS — Z0183 Encounter for blood typing: Secondary | ICD-10-CM | POA: Insufficient documentation

## 2017-05-28 DIAGNOSIS — M4316 Spondylolisthesis, lumbar region: Secondary | ICD-10-CM | POA: Diagnosis not present

## 2017-05-28 DIAGNOSIS — M48061 Spinal stenosis, lumbar region without neurogenic claudication: Secondary | ICD-10-CM | POA: Diagnosis not present

## 2017-05-28 DIAGNOSIS — M5116 Intervertebral disc disorders with radiculopathy, lumbar region: Secondary | ICD-10-CM | POA: Insufficient documentation

## 2017-05-28 LAB — SURGICAL PCR SCREEN
MRSA, PCR: NEGATIVE
Staphylococcus aureus: POSITIVE — AB

## 2017-05-28 LAB — CBC
HEMATOCRIT: 43.1 % (ref 39.0–52.0)
Hemoglobin: 14.5 g/dL (ref 13.0–17.0)
MCH: 31.7 pg (ref 26.0–34.0)
MCHC: 33.6 g/dL (ref 30.0–36.0)
MCV: 94.3 fL (ref 78.0–100.0)
Platelets: 181 10*3/uL (ref 150–400)
RBC: 4.57 MIL/uL (ref 4.22–5.81)
RDW: 13.3 % (ref 11.5–15.5)
WBC: 3.6 10*3/uL — ABNORMAL LOW (ref 4.0–10.5)

## 2017-05-28 LAB — TYPE AND SCREEN
ABO/RH(D): A POS
Antibody Screen: NEGATIVE

## 2017-05-29 LAB — ABO/RH: ABO/RH(D): A POS

## 2017-06-03 ENCOUNTER — Other Ambulatory Visit: Payer: Self-pay

## 2017-06-03 ENCOUNTER — Other Ambulatory Visit: Payer: Self-pay | Admitting: Neurosurgery

## 2017-06-03 ENCOUNTER — Inpatient Hospital Stay (HOSPITAL_COMMUNITY): Payer: BC Managed Care – PPO | Admitting: Certified Registered"

## 2017-06-03 ENCOUNTER — Inpatient Hospital Stay (HOSPITAL_COMMUNITY): Payer: BC Managed Care – PPO

## 2017-06-03 ENCOUNTER — Encounter (HOSPITAL_COMMUNITY): Payer: Self-pay | Admitting: *Deleted

## 2017-06-03 ENCOUNTER — Encounter (HOSPITAL_COMMUNITY)
Admission: RE | Disposition: A | Discharge status: Home / Self Care | Payer: Self-pay | Source: Ambulatory Visit | Attending: Neurosurgery

## 2017-06-03 ENCOUNTER — Inpatient Hospital Stay (HOSPITAL_COMMUNITY)
Admission: RE | Admit: 2017-06-03 | Discharge: 2017-06-05 | DRG: 455 | Disposition: A | Discharge status: Home / Self Care | Payer: BC Managed Care – PPO | Source: Ambulatory Visit | Attending: Neurosurgery | Admitting: Neurosurgery

## 2017-06-03 DIAGNOSIS — R609 Edema, unspecified: Secondary | ICD-10-CM | POA: Diagnosis not present

## 2017-06-03 DIAGNOSIS — M21372 Foot drop, left foot: Secondary | ICD-10-CM | POA: Diagnosis present

## 2017-06-03 DIAGNOSIS — M5116 Intervertebral disc disorders with radiculopathy, lumbar region: Secondary | ICD-10-CM | POA: Diagnosis present

## 2017-06-03 DIAGNOSIS — M4306 Spondylolysis, lumbar region: Secondary | ICD-10-CM | POA: Diagnosis present

## 2017-06-03 DIAGNOSIS — M4316 Spondylolisthesis, lumbar region: Secondary | ICD-10-CM | POA: Diagnosis present

## 2017-06-03 DIAGNOSIS — Z419 Encounter for procedure for purposes other than remedying health state, unspecified: Secondary | ICD-10-CM

## 2017-06-03 DIAGNOSIS — M48061 Spinal stenosis, lumbar region without neurogenic claudication: Secondary | ICD-10-CM | POA: Diagnosis present

## 2017-06-03 SURGERY — POSTERIOR LUMBAR FUSION 1 LEVEL
Anesthesia: General | Site: Back

## 2017-06-03 MED ORDER — ROCURONIUM BROMIDE 10 MG/ML (PF) SYRINGE
PREFILLED_SYRINGE | INTRAVENOUS | Status: DC | PRN
Start: 2017-06-03 — End: 2017-06-03
  Administered 2017-06-03: 10 mg via INTRAVENOUS
  Administered 2017-06-03: 50 mg via INTRAVENOUS
  Administered 2017-06-03: 10 mg via INTRAVENOUS

## 2017-06-03 MED ORDER — DEXAMETHASONE SODIUM PHOSPHATE 10 MG/ML IJ SOLN
INTRAMUSCULAR | Status: AC
  Filled 2017-06-03: qty 1

## 2017-06-03 MED ORDER — CHLORHEXIDINE GLUCONATE CLOTH 2 % EX PADS: 6.0000 | MEDICATED_PAD | Freq: Once | CUTANEOUS | Status: DC

## 2017-06-03 MED ORDER — ZOLPIDEM TARTRATE 5 MG PO TABS: 5.0000 mg | ORAL_TABLET | Freq: Every evening | ORAL | Status: DC | PRN

## 2017-06-03 MED ORDER — ALUM & MAG HYDROXIDE-SIMETH 200-200-20 MG/5ML PO SUSP: 30.0000 mL | Freq: Four times a day (QID) | ORAL | Status: DC | PRN

## 2017-06-03 MED ORDER — BUPIVACAINE HCL (PF) 0.5 % IJ SOLN
INTRAMUSCULAR | Status: DC | PRN
  Administered 2017-06-03: 5 mL

## 2017-06-03 MED ORDER — ONDANSETRON HCL 4 MG/2ML IJ SOLN
INTRAMUSCULAR | Status: AC
  Filled 2017-06-03: qty 2

## 2017-06-03 MED ORDER — DEXTROSE 5 % IV SOLN
500.0000 mg | Freq: Four times a day (QID) | INTRAVENOUS | Status: DC | PRN
  Filled 2017-06-03: qty 5

## 2017-06-03 MED ORDER — MIDAZOLAM HCL 2 MG/2ML IJ SOLN
INTRAMUSCULAR | Status: AC
  Filled 2017-06-03: qty 2

## 2017-06-03 MED ORDER — ONDANSETRON HCL 4 MG/2ML IJ SOLN: 4.0000 mg | Freq: Four times a day (QID) | INTRAMUSCULAR | Status: DC | PRN

## 2017-06-03 MED ORDER — METHOCARBAMOL 500 MG PO TABS
500.0000 mg | ORAL_TABLET | Freq: Four times a day (QID) | ORAL | Status: DC | PRN
  Administered 2017-06-03 – 2017-06-05 (×8): 500 mg via ORAL
  Filled 2017-06-03 (×8): qty 1

## 2017-06-03 MED ORDER — MENTHOL 3 MG MT LOZG: 1.0000 | LOZENGE | OROMUCOSAL | Status: DC | PRN

## 2017-06-03 MED ORDER — ONDANSETRON HCL 4 MG/2ML IJ SOLN
INTRAMUSCULAR | Status: DC | PRN
  Administered 2017-06-03: 4 mg via INTRAVENOUS

## 2017-06-03 MED ORDER — PROPOFOL 10 MG/ML IV BOLUS
INTRAVENOUS | Status: AC
  Filled 2017-06-03: qty 40

## 2017-06-03 MED ORDER — PHENOL 1.4 % MT LIQD: 1.0000 | OROMUCOSAL | Status: DC | PRN

## 2017-06-03 MED ORDER — FAMOTIDINE IN NACL 20-0.9 MG/50ML-% IV SOLN: 20.0000 mg | Freq: Two times a day (BID) | INTRAVENOUS | Status: DC

## 2017-06-03 MED ORDER — CEFAZOLIN SODIUM-DEXTROSE 2-4 GM/100ML-% IV SOLN
2.0000 g | INTRAVENOUS | Status: AC
  Administered 2017-06-03: 2 g via INTRAVENOUS
  Filled 2017-06-03: qty 100

## 2017-06-03 MED ORDER — THROMBIN (RECOMBINANT) 5000 UNITS EX SOLR
CUTANEOUS | Status: AC
  Filled 2017-06-03: qty 5000

## 2017-06-03 MED ORDER — MEPERIDINE HCL 50 MG/ML IJ SOLN: 6.2500 mg | INTRAMUSCULAR | Status: DC | PRN

## 2017-06-03 MED ORDER — SUGAMMADEX SODIUM 200 MG/2ML IV SOLN
INTRAVENOUS | Status: DC | PRN
  Administered 2017-06-03: 160 mg via INTRAVENOUS

## 2017-06-03 MED ORDER — BISACODYL 10 MG RE SUPP: 10.0000 mg | Freq: Every day | RECTAL | Status: DC | PRN

## 2017-06-03 MED ORDER — ROCURONIUM BROMIDE 10 MG/ML (PF) SYRINGE
PREFILLED_SYRINGE | INTRAVENOUS | Status: AC
  Filled 2017-06-03: qty 5

## 2017-06-03 MED ORDER — BUPIVACAINE LIPOSOME 1.3 % IJ SUSP
20.0000 mL | INTRAMUSCULAR | Status: AC
  Administered 2017-06-03: 20 mL
  Filled 2017-06-03: qty 20

## 2017-06-03 MED ORDER — DOCUSATE SODIUM 100 MG PO CAPS
100.0000 mg | ORAL_CAPSULE | Freq: Two times a day (BID) | ORAL | Status: DC
  Administered 2017-06-03 – 2017-06-05 (×4): 100 mg via ORAL
  Filled 2017-06-03 (×4): qty 1

## 2017-06-03 MED ORDER — SODIUM CHLORIDE 0.9% FLUSH: 3.0000 mL | INTRAVENOUS | Status: DC | PRN

## 2017-06-03 MED ORDER — THROMBIN (RECOMBINANT) 5000 UNITS EX SOLR
OROMUCOSAL | Status: DC | PRN
  Administered 2017-06-03: 09:00:00 via TOPICAL

## 2017-06-03 MED ORDER — FENTANYL CITRATE (PF) 250 MCG/5ML IJ SOLN
INTRAMUSCULAR | Status: DC | PRN
  Administered 2017-06-03: 50 ug via INTRAVENOUS
  Administered 2017-06-03: 100 ug via INTRAVENOUS
  Administered 2017-06-03 (×3): 50 ug via INTRAVENOUS

## 2017-06-03 MED ORDER — LACTATED RINGERS IV SOLN
INTRAVENOUS | Status: DC | PRN
  Administered 2017-06-03 (×2): via INTRAVENOUS

## 2017-06-03 MED ORDER — 0.9 % SODIUM CHLORIDE (POUR BTL) OPTIME
TOPICAL | Status: DC | PRN
  Administered 2017-06-03: 1000 mL

## 2017-06-03 MED ORDER — LIDOCAINE 2% (20 MG/ML) 5 ML SYRINGE
INTRAMUSCULAR | Status: AC
  Filled 2017-06-03: qty 5

## 2017-06-03 MED ORDER — HYDROCODONE-ACETAMINOPHEN 5-325 MG PO TABS
2.0000 | ORAL_TABLET | ORAL | Status: DC | PRN
  Administered 2017-06-03 – 2017-06-04 (×5): 2 via ORAL
  Filled 2017-06-03 (×5): qty 2

## 2017-06-03 MED ORDER — ONDANSETRON HCL 4 MG/2ML IJ SOLN: 4.0000 mg | Freq: Once | INTRAMUSCULAR | Status: DC | PRN

## 2017-06-03 MED ORDER — HYDROMORPHONE HCL 2 MG/ML IJ SOLN
0.2500 mg | INTRAMUSCULAR | Status: DC | PRN
  Administered 2017-06-03 (×2): 0.5 mg via INTRAVENOUS

## 2017-06-03 MED ORDER — KCL IN DEXTROSE-NACL 20-5-0.45 MEQ/L-%-% IV SOLN: INTRAVENOUS | Status: DC

## 2017-06-03 MED ORDER — FENTANYL CITRATE (PF) 250 MCG/5ML IJ SOLN
INTRAMUSCULAR | Status: AC
  Filled 2017-06-03: qty 5

## 2017-06-03 MED ORDER — ACETAMINOPHEN 325 MG PO TABS
650.0000 mg | ORAL_TABLET | ORAL | Status: DC | PRN
  Administered 2017-06-04 – 2017-06-05 (×2): 650 mg via ORAL
  Filled 2017-06-03 (×2): qty 2

## 2017-06-03 MED ORDER — ACETAMINOPHEN 650 MG RE SUPP: 650.0000 mg | RECTAL | Status: DC | PRN

## 2017-06-03 MED ORDER — CEFAZOLIN SODIUM-DEXTROSE 2-4 GM/100ML-% IV SOLN
2.0000 g | Freq: Three times a day (TID) | INTRAVENOUS | Status: AC
  Administered 2017-06-03 (×2): 2 g via INTRAVENOUS
  Filled 2017-06-03 (×2): qty 100

## 2017-06-03 MED ORDER — OXYCODONE HCL 5 MG PO TABS: 5.0000 mg | ORAL_TABLET | ORAL | Status: DC | PRN

## 2017-06-03 MED ORDER — LIDOCAINE-EPINEPHRINE 1 %-1:100000 IJ SOLN
INTRAMUSCULAR | Status: DC | PRN
  Administered 2017-06-03: 5 mL

## 2017-06-03 MED ORDER — PROPOFOL 10 MG/ML IV BOLUS
INTRAVENOUS | Status: DC | PRN
  Administered 2017-06-03: 150 mg via INTRAVENOUS

## 2017-06-03 MED ORDER — BUPIVACAINE HCL (PF) 0.5 % IJ SOLN
INTRAMUSCULAR | Status: AC
  Filled 2017-06-03: qty 30

## 2017-06-03 MED ORDER — SODIUM CHLORIDE 0.9 % IV SOLN: 250.0000 mL | INTRAVENOUS | Status: DC

## 2017-06-03 MED ORDER — LIDOCAINE-EPINEPHRINE 1 %-1:100000 IJ SOLN
INTRAMUSCULAR | Status: AC
  Filled 2017-06-03: qty 1

## 2017-06-03 MED ORDER — SODIUM CHLORIDE 0.9% FLUSH: 3.0000 mL | Freq: Two times a day (BID) | INTRAVENOUS | Status: DC

## 2017-06-03 MED ORDER — MORPHINE SULFATE (PF) 4 MG/ML IV SOLN
2.0000 mg | INTRAVENOUS | Status: DC | PRN
  Administered 2017-06-03 – 2017-06-04 (×2): 2 mg via INTRAVENOUS
  Filled 2017-06-03 (×3): qty 1

## 2017-06-03 MED ORDER — POLYETHYLENE GLYCOL 3350 17 G PO PACK: 17.0000 g | PACK | Freq: Every day | ORAL | Status: DC | PRN

## 2017-06-03 MED ORDER — DEXAMETHASONE SODIUM PHOSPHATE 10 MG/ML IJ SOLN
INTRAMUSCULAR | Status: DC | PRN
  Administered 2017-06-03: 10 mg via INTRAVENOUS

## 2017-06-03 MED ORDER — ONDANSETRON HCL 4 MG PO TABS: 4.0000 mg | ORAL_TABLET | Freq: Four times a day (QID) | ORAL | Status: DC | PRN

## 2017-06-03 MED ORDER — LIDOCAINE 2% (20 MG/ML) 5 ML SYRINGE
INTRAMUSCULAR | Status: DC | PRN
Start: 2017-06-03 — End: 2017-06-03
  Administered 2017-06-03: 100 mg via INTRAVENOUS

## 2017-06-03 MED ORDER — HYDROMORPHONE HCL 2 MG/ML IJ SOLN
INTRAMUSCULAR | Status: AC
  Filled 2017-06-03: qty 1

## 2017-06-03 MED ORDER — FAMOTIDINE 20 MG PO TABS
20.0000 mg | ORAL_TABLET | Freq: Two times a day (BID) | ORAL | Status: DC
  Administered 2017-06-04 – 2017-06-05 (×3): 20 mg via ORAL
  Filled 2017-06-03 (×3): qty 1

## 2017-06-03 MED ORDER — ARTIFICIAL TEARS OPHTHALMIC OINT
TOPICAL_OINTMENT | OPHTHALMIC | Status: AC
  Filled 2017-06-03: qty 3.5

## 2017-06-03 MED ORDER — MIDAZOLAM HCL 5 MG/5ML IJ SOLN
INTRAMUSCULAR | Status: DC | PRN
  Administered 2017-06-03: 2 mg via INTRAVENOUS

## 2017-06-03 MED ORDER — FLEET ENEMA 7-19 GM/118ML RE ENEM: 1.0000 | ENEMA | Freq: Once | RECTAL | Status: DC | PRN

## 2017-06-03 SURGICAL SUPPLY — 82 items
BASKET BONE COLLECTION (BASKET) ×3 IMPLANT
BLADE CLIPPER SURG (BLADE) ×3 IMPLANT
BONE CANC CHIPS 20CC PCAN1/4 (Bone Implant) ×3 IMPLANT
BUR MATCHSTICK NEURO 3.0 LAGG (BURR) ×3 IMPLANT
BUR PRECISION FLUTE 5.0 (BURR) ×3 IMPLANT
CAGE COROENT 11X9X23-8 (Cage) ×6 IMPLANT
CANISTER SUCT 3000ML PPV (MISCELLANEOUS) ×3 IMPLANT
CARTRIDGE OIL MAESTRO DRILL (MISCELLANEOUS) ×1 IMPLANT
CHIPS CANC BONE 20CC PCAN1/4 (Bone Implant) ×1 IMPLANT
CONT SPEC 4OZ CLIKSEAL STRL BL (MISCELLANEOUS) ×3 IMPLANT
COVER BACK TABLE 60X90IN (DRAPES) ×3 IMPLANT
DECANTER SPIKE VIAL GLASS SM (MISCELLANEOUS) ×3 IMPLANT
DERMABOND ADHESIVE PROPEN (GAUZE/BANDAGES/DRESSINGS) ×2
DERMABOND ADVANCED (GAUZE/BANDAGES/DRESSINGS) ×2
DERMABOND ADVANCED .7 DNX12 (GAUZE/BANDAGES/DRESSINGS) ×1 IMPLANT
DERMABOND ADVANCED .7 DNX6 (GAUZE/BANDAGES/DRESSINGS) ×1 IMPLANT
DIFFUSER DRILL AIR PNEUMATIC (MISCELLANEOUS) ×3 IMPLANT
DRAPE C-ARM 42X72 X-RAY (DRAPES) ×3 IMPLANT
DRAPE C-ARMOR (DRAPES) ×3 IMPLANT
DRAPE LAPAROTOMY 100X72X124 (DRAPES) ×3 IMPLANT
DRAPE SURG 17X23 STRL (DRAPES) ×3 IMPLANT
DRSG OPSITE POSTOP 4X6 (GAUZE/BANDAGES/DRESSINGS) ×3 IMPLANT
DURAPREP 26ML APPLICATOR (WOUND CARE) ×3 IMPLANT
ELECT BLADE 4.0 EZ CLEAN MEGAD (MISCELLANEOUS) ×3
ELECT REM PT RETURN 9FT ADLT (ELECTROSURGICAL) ×3
ELECTRODE BLDE 4.0 EZ CLN MEGD (MISCELLANEOUS) ×1 IMPLANT
ELECTRODE REM PT RTRN 9FT ADLT (ELECTROSURGICAL) ×1 IMPLANT
EVACUATOR 1/8 PVC DRAIN (DRAIN) IMPLANT
GAUZE SPONGE 4X4 12PLY STRL (GAUZE/BANDAGES/DRESSINGS) IMPLANT
GAUZE SPONGE 4X4 16PLY XRAY LF (GAUZE/BANDAGES/DRESSINGS) IMPLANT
GLOVE BIO SURGEON STRL SZ8 (GLOVE) ×9 IMPLANT
GLOVE BIOGEL PI IND STRL 6.5 (GLOVE) ×2 IMPLANT
GLOVE BIOGEL PI IND STRL 8 (GLOVE) ×2 IMPLANT
GLOVE BIOGEL PI IND STRL 8.5 (GLOVE) ×2 IMPLANT
GLOVE BIOGEL PI INDICATOR 6.5 (GLOVE) ×4
GLOVE BIOGEL PI INDICATOR 8 (GLOVE) ×4
GLOVE BIOGEL PI INDICATOR 8.5 (GLOVE) ×4
GLOVE ECLIPSE 8.0 STRL XLNG CF (GLOVE) ×9 IMPLANT
GLOVE EXAM NITRILE LRG STRL (GLOVE) IMPLANT
GLOVE EXAM NITRILE XL STR (GLOVE) IMPLANT
GLOVE EXAM NITRILE XS STR PU (GLOVE) IMPLANT
GLOVE INDICATOR 8.5 STRL (GLOVE) ×3 IMPLANT
GLOVE SURG SS PI 6.5 STRL IVOR (GLOVE) ×15 IMPLANT
GOWN STRL REUS W/ TWL LRG LVL3 (GOWN DISPOSABLE) ×2 IMPLANT
GOWN STRL REUS W/ TWL XL LVL3 (GOWN DISPOSABLE) ×3 IMPLANT
GOWN STRL REUS W/TWL 2XL LVL3 (GOWN DISPOSABLE) ×6 IMPLANT
GOWN STRL REUS W/TWL LRG LVL3 (GOWN DISPOSABLE) ×4
GOWN STRL REUS W/TWL XL LVL3 (GOWN DISPOSABLE) ×6
HEMOSTAT POWDER KIT SURGIFOAM (HEMOSTASIS) ×3 IMPLANT
KIT BASIN OR (CUSTOM PROCEDURE TRAY) ×3 IMPLANT
KIT POSITION SURG JACKSON T1 (MISCELLANEOUS) ×3 IMPLANT
KIT TURNOVER KIT B (KITS) ×3 IMPLANT
MARKER SKIN DUAL TIP RULER LAB (MISCELLANEOUS) ×3 IMPLANT
MILL MEDIUM DISP (BLADE) ×3 IMPLANT
NEEDLE HYPO 21X1.5 SAFETY (NEEDLE) ×3 IMPLANT
NEEDLE HYPO 25X1 1.5 SAFETY (NEEDLE) ×3 IMPLANT
NEEDLE SPNL 18GX3.5 QUINCKE PK (NEEDLE) ×3 IMPLANT
NS IRRIG 1000ML POUR BTL (IV SOLUTION) ×3 IMPLANT
OIL CARTRIDGE MAESTRO DRILL (MISCELLANEOUS) ×3
PACK LAMINECTOMY NEURO (CUSTOM PROCEDURE TRAY) ×3 IMPLANT
PAD ARMBOARD 7.5X6 YLW CONV (MISCELLANEOUS) ×6 IMPLANT
PATTIES SURGICAL .5 X.5 (GAUZE/BANDAGES/DRESSINGS) IMPLANT
PATTIES SURGICAL .5 X1 (DISPOSABLE) IMPLANT
PATTIES SURGICAL 1X1 (DISPOSABLE) ×3 IMPLANT
ROD RELINE-O LORD 5.5X35MM (Rod) ×3 IMPLANT
ROD RELINE-O LORD 5.5X40 (Rod) ×3 IMPLANT
SCREW LOCK RELINE 5.5 TULIP (Screw) ×12 IMPLANT
SCREW RELINE-O POLY 6.5X45 (Screw) ×6 IMPLANT
SCREW RELINE-O POLY 6.5X50MM (Screw) ×6 IMPLANT
SPONGE LAP 4X18 X RAY DECT (DISPOSABLE) IMPLANT
SPONGE SURGIFOAM ABS GEL 100 (HEMOSTASIS) IMPLANT
STAPLER SKIN PROX WIDE 3.9 (STAPLE) IMPLANT
SUT VIC AB 1 CT1 18XBRD ANBCTR (SUTURE) ×1 IMPLANT
SUT VIC AB 1 CT1 8-18 (SUTURE) ×2
SUT VIC AB 2-0 CT1 18 (SUTURE) ×3 IMPLANT
SUT VIC AB 3-0 SH 8-18 (SUTURE) ×6 IMPLANT
SYR 5ML LL (SYRINGE) IMPLANT
SYRINGE 20CC LL (MISCELLANEOUS) ×3 IMPLANT
TOWEL GREEN STERILE (TOWEL DISPOSABLE) ×3 IMPLANT
TOWEL GREEN STERILE FF (TOWEL DISPOSABLE) ×3 IMPLANT
TRAY FOLEY MTR SLVR 16FR STAT (SET/KITS/TRAYS/PACK) ×3 IMPLANT
WATER STERILE IRR 1000ML POUR (IV SOLUTION) ×3 IMPLANT

## 2017-06-03 NOTE — Anesthesia Postprocedure Evaluation (Signed)
Anesthesia Post Note  Patient: Jack Huynh  Procedure(s) Performed: Lumbar four Gill procedure with Lumbar four-five Posterior lumbar interbody fusion (N/A Back)     Patient location during evaluation: PACU Anesthesia Type: General Level of consciousness: awake and alert Pain management: pain level controlled Vital Signs Assessment: post-procedure vital signs reviewed and stable Respiratory status: spontaneous breathing, nonlabored ventilation, respiratory function stable and patient connected to nasal cannula oxygen Cardiovascular status: blood pressure returned to baseline and stable Postop Assessment: no apparent nausea or vomiting Anesthetic complications: no    Last Vitals:  Vitals:   06/03/17 1123 06/03/17 1155  BP: 127/84 116/77  Pulse: 67 (!) 59  Resp: 11 20  Temp: (!) 36.3 C   SpO2: 99% 98%    Last Pain:  Vitals:   06/03/17 1236  TempSrc:   PainSc: 10-Worst pain ever                 Zohra Clavel DAVID

## 2017-06-03 NOTE — Op Note (Addendum)
06/03/2017  10:43 AM  PATIENT:  Jack Huynh  47 y.o. male  PRE-OPERATIVE DIAGNOSIS:  Spondylolisthesis of lumbar region with spondylolysis, stenosis, HNP, lumbago, radiculopathy L 45   POST-OPERATIVE DIAGNOSIS:  Spondylolisthesis of lumbar region with spondylolysis, stenosis, HNP, lumbago, radiculopathy L 45   PROCEDURE:  Procedure(s): Lumbar four Gill procedure with Lumbar four-five Posterior lumbar interbody fusion (N/A) with PEEK cages, autograft, pedicle screw fixation, posterolateral arthrodesis   SURGEON:  Surgeon(s) and Role:    Maeola Harman, MD - Primary    * Donalee Citrin, MD - Assisting  PHYSICIAN ASSISTANT:   ASSISTANTS: Poteat, RN   ANESTHESIA:   general  EBL:  200 mL   BLOOD ADMINISTERED:none  DRAINS: none   LOCAL MEDICATIONS USED:  MARCAINE    and LIDOCAINE   SPECIMEN:  No Specimen  DISPOSITION OF SPECIMEN:  N/A  COUNTS:  YES  TOURNIQUET:  * No tourniquets in log *  DICTATION: DICTATION: Patient is 47 year old man with L 4 spondylolysis and mobile Grade II spondylolisthesis of L 4 on L 5 with retrolisthesis of L 4 on L 5 with lumbar stenosis. He has a severe right L4 radiculopathy. it was elected to take him to surgery for decompression and fusion at the L 45 level.   Procedure: Patient was placed in a prone position on the [employer] table after smooth and uncomplicated induction of general endotracheal anesthesia. His low back was prepped and draped in usual sterile fashion with betadine scrub and DuraPrep after marking the planned incision with C arm. Area of incision was infiltrated with local lidocaine. Incision was made to the lumbodorsal fascia was incised and exposure was performed of the L 4 spinous processes laminae facet joint and transverse processes. Intraoperative x-ray was obtained which confirmed correct orientation. A Gill procedure of L4 was performed with disarticulation of the facet joints at this level and thorough decompression was performed  of both L 4 and L 5 nerve roots along with the common dural tube. The cartilagenous material from the pars defect was removed with thorough decompression of both L 4 nerve roots.   Using sequential paddle distractors, the interspace was opened and the spondylolisthesis was reduced.  All neural elements were decompressed and trial sizers were placed, followed by two PEEK cages (11 x 9 x 23 mm x 8 degree) which were packed with autograft.  10 cc autograft was packed in the interspace medial to the second cage.  The posterolateral region was extensively decorticated and pedicle probes were placed at L 4 and L5 bilaterally. Intraoperative fluoroscopy confirmed correct orientation in the AP and lateral plane. 45 x 6.5 mm pedicle screws were placed at L 5  bilaterally and 50 x 6.5 mm screws placed at L4 bilaterally.  final x-rays demonstrated well-positioned pedicle screw fixation. A 40 mm lordotic rod was placed on the left and a 35 mm rod was placed on the right locked down in situ and the posterolateral region was packed with the25 cc bone autograft and allograft on the right and 20 cc on the left. Wound was irrigated.  Subcutaneous tissues were infiltrated with  20 cc of long-acting Marcaine was used in the posterolateral soft tissue. Fascia was closed with 1 Vicryl sutures skin edges were reapproximated 2 and 3-0 Vicryl sutures. The wound is dressed with Dermabond and an occlusive dressing. The patient was extubated in the operating room and taken to recovery in stable satisfactory condition he tolerated the operation well counts were correct at the  end of the case.  PLAN OF CARE: Admit to inpatient   PATIENT DISPOSITION:  PACU - hemodynamically stable.   Delay start of Pharmacological VTE agent (>24hrs) due to surgical blood loss or risk of bleeding: yes  Pelvic Parameters:  PT 8 PI 55 LL 67 PI-LL -12

## 2017-06-03 NOTE — Transfer of Care (Signed)
Immediate Anesthesia Transfer of Care Note  Patient: Jack Huynh  Procedure(s) Performed: Lumbar four Gill procedure with Lumbar four-five Posterior lumbar interbody fusion (N/A Back)  Patient Location: PACU  Anesthesia Type:General  Level of Consciousness: drowsy  Airway & Oxygen Therapy: Patient Spontanous Breathing and Patient connected to nasal cannula oxygen  Post-op Assessment: Report given to RN and Post -op Vital signs reviewed and stable  Post vital signs: Reviewed and stable  Last Vitals:  Vitals Value Taken Time  BP 120/70 06/03/2017 10:54 AM  Temp    Pulse 79 06/03/2017 10:54 AM  Resp 14 06/03/2017 10:54 AM  SpO2 97 % 06/03/2017 10:54 AM  Vitals shown include unvalidated device data.  Last Pain:  Vitals:   06/03/17 0656  TempSrc:   PainSc: 4          Complications: No apparent anesthesia complications

## 2017-06-03 NOTE — Anesthesia Preprocedure Evaluation (Signed)
Anesthesia Evaluation  Patient identified by MRN, date of birth, ID band Patient awake    Reviewed: Allergy & Precautions, NPO status , Patient's Chart, lab work & pertinent test results  Airway Mallampati: I  TM Distance: >3 FB Neck ROM: Full    Dental   Pulmonary    Pulmonary exam normal        Cardiovascular Normal cardiovascular exam     Neuro/Psych    GI/Hepatic   Endo/Other    Renal/GU      Musculoskeletal   Abdominal   Peds  Hematology   Anesthesia Other Findings   Reproductive/Obstetrics                             Anesthesia Physical Anesthesia Plan  ASA: II  Anesthesia Plan: General   Post-op Pain Management:    Induction: Intravenous  PONV Risk Score and Plan: 2 and Ondansetron and Midazolam  Airway Management Planned: Oral ETT  Additional Equipment:   Intra-op Plan:   Post-operative Plan: Extubation in OR  Informed Consent: I have reviewed the patients History and Physical, chart, labs and discussed the procedure including the risks, benefits and alternatives for the proposed anesthesia with the patient or authorized representative who has indicated his/her understanding and acceptance.       Plan Discussed with: CRNA and Surgeon  Anesthesia Plan Comments:         Anesthesia Quick Evaluation  

## 2017-06-03 NOTE — Brief Op Note (Addendum)
06/03/2017  10:43 AM  PATIENT:  Jack Huynh  47 y.o. male  PRE-OPERATIVE DIAGNOSIS:  Spondylolisthesis of lumbar region with spondylolysis, stenosis, HNP, lumbago, radiculopathy L 45   POST-OPERATIVE DIAGNOSIS:  Spondylolisthesis of lumbar region with spondylolysis, stenosis, HNP, lumbago, radiculopathy L 45   PROCEDURE:  Procedure(s): Lumbar four Gill procedure with Lumbar four-five Posterior lumbar interbody fusion (N/A) with PEEK cages, autograft, pedicle screw fixation, posterolateral arthrodesis   SURGEON:  Surgeon(s) and Role:    Maeola Harman, MD - Primary    * Donalee Citrin, MD - Assisting  PHYSICIAN ASSISTANT:   ASSISTANTS: Poteat, RN   ANESTHESIA:   general  EBL:  200 mL   BLOOD ADMINISTERED:none  DRAINS: none   LOCAL MEDICATIONS USED:  MARCAINE    and LIDOCAINE   SPECIMEN:  No Specimen  DISPOSITION OF SPECIMEN:  N/A  COUNTS:  YES  TOURNIQUET:  * No tourniquets in log *  DICTATION: DICTATION: Patient is 47 year old man with L 4 spondylolysis and mobile Grade II spondylolisthesis of L 4 on L 5 with retrolisthesis of L 4 on L 5 with lumbar stenosis. He has a severe right L4 radiculopathy. it was elected to take him to surgery for decompression and fusion at the L 45 level.   Procedure: Patient was placed in a prone position on the [facility] table after smooth and uncomplicated induction of general endotracheal anesthesia. His low back was prepped and draped in usual sterile fashion with betadine scrub and DuraPrep after marking the planned incision with C arm. Area of incision was infiltrated with local lidocaine. Incision was made to the lumbodorsal fascia was incised and exposure was performed of the L 4 spinous processes laminae facet joint and transverse processes. Intraoperative x-ray was obtained which confirmed correct orientation. A Gill procedure of L4 was performed with disarticulation of the facet joints at this level and thorough decompression was performed  of both L 4 and L 5 nerve roots along with the common dural tube. The cartilagenous material from the pars defect was removed with thorough decompression of both L 4 nerve roots.   Using sequential paddle distractors, the interspace was opened and the spondylolisthesis was reduced.  All neural elements were decompressed and trial sizers were placed, followed by two PEEK cages (11 x 9 x 23 mm x 8 degree) which were packed with autograft.  10 cc autograft was packed in the interspace medial to the second cage.  The posterolateral region was extensively decorticated and pedicle probes were placed at L 4 and L5 bilaterally. Intraoperative fluoroscopy confirmed correct orientation in the AP and lateral plane. 45 x 6.5 mm pedicle screws were placed at L 5  bilaterally and 50 x 6.5 mm screws placed at L4 bilaterally.  final x-rays demonstrated well-positioned pedicle screw fixation. A 35 mm lordotic rod was placed on the right and a 40 mm rod was placed on the left locked down in situ and the posterolateral region was packed with the25 cc bone autograft and allograft on the right and 20 cc on the left. Wound was irrigated.  Subcutaneous tissues were infiltrated with  20 cc of long-acting Marcaine was used in the posterolateral soft tissue. Fascia was closed with 1 Vicryl sutures skin edges were reapproximated 2 and 3-0 Vicryl sutures. The wound is dressed with Dermabond and an occlusive dressing. The patient was extubated in the operating room and taken to recovery in stable satisfactory condition he tolerated the operation well counts were correct at the  end of the case.  PLAN OF CARE: Admit to inpatient   PATIENT DISPOSITION:  PACU - hemodynamically stable.   Delay start of Pharmacological VTE agent (>24hrs) due to surgical blood loss or risk of bleeding: yes  Pelvic Parameters:  PT 8 PI 55 LL 67 PI-LL -12

## 2017-06-03 NOTE — Progress Notes (Signed)
Awake, alert, conversant.  Minimal pain.  Strength full in both lower extremities.  Doing well.

## 2017-06-03 NOTE — Interval H&P Note (Signed)
History and Physical Interval Note:  06/03/2017 7:41 AM  Jack Huynh  has presented today for surgery, with the diagnosis of Spondylolisthesis of lumbar region  The various methods of treatment have been discussed with the patient and family. After consideration of risks, benefits and other options for treatment, the patient has consented to  Procedure(s) with comments: L4 Gill procedure with L4-5 Posterior lumbar interbody fusion (N/A) - L4 Gill procedure with L4-5 Posterior lumbar interbody fusion as a surgical intervention .  The patient's history has been reviewed, patient examined, no change in status, stable for surgery.  I have reviewed the patient's chart and labs.  Questions were answered to the patient's satisfaction.     Danyla Wattley D

## 2017-06-03 NOTE — Anesthesia Procedure Notes (Signed)
Procedure Name: Intubation Date/Time: 06/03/2017 7:46 AM Performed by: Elliot Dally, CRNA Pre-anesthesia Checklist: Patient identified, Emergency Drugs available, Suction available and Patient being monitored Patient Re-evaluated:Patient Re-evaluated prior to induction Oxygen Delivery Method: Circle System Utilized Preoxygenation: Pre-oxygenation with 100% oxygen Induction Type: IV induction Ventilation: Mask ventilation without difficulty Laryngoscope Size: Miller and 3 Grade View: Grade I Tube type: Oral Tube size: 7.5 mm Number of attempts: 1 Airway Equipment and Method: Stylet and Oral airway Placement Confirmation: ETT inserted through vocal cords under direct vision,  positive ETCO2 and breath sounds checked- equal and bilateral Secured at: 23 cm Tube secured with: Tape Dental Injury: Teeth and Oropharynx as per pre-operative assessment

## 2017-06-04 ENCOUNTER — Inpatient Hospital Stay (HOSPITAL_COMMUNITY): Payer: BC Managed Care – PPO

## 2017-06-04 DIAGNOSIS — R609 Edema, unspecified: Secondary | ICD-10-CM

## 2017-06-04 MED ORDER — OXYCODONE HCL 5 MG PO TABS
10.0000 mg | ORAL_TABLET | ORAL | Status: DC | PRN
  Administered 2017-06-04 – 2017-06-05 (×5): 10 mg via ORAL
  Filled 2017-06-04 (×5): qty 2

## 2017-06-04 MED ORDER — HYDROCODONE-ACETAMINOPHEN 10-325 MG PO TABS
1.0000 | ORAL_TABLET | ORAL | Status: DC | PRN
  Administered 2017-06-04: 1 via ORAL
  Administered 2017-06-04: 2 via ORAL
  Filled 2017-06-04 (×3): qty 2
  Filled 2017-06-04: qty 1

## 2017-06-04 MED ORDER — MORPHINE SULFATE (PF) 4 MG/ML IV SOLN
2.0000 mg | INTRAVENOUS | Status: AC
  Administered 2017-06-04: 2 mg via INTRAMUSCULAR

## 2017-06-04 NOTE — Progress Notes (Signed)
Subjective: Patient reports sore, but improving  Objective: Vital signs in last 24 hours: Temp:  [97.3 F (36.3 C)-98.6 F (37 C)] 98.1 F (36.7 C) (05/03 0728) Pulse Rate:  [59-87] 62 (05/03 0728) Resp:  [11-20] 18 (05/03 0728) BP: (101-138)/(55-88) 125/81 (05/03 0728) SpO2:  [97 %-100 %] 100 % (05/03 0728) Weight:  [85.3 kg (188 lb)] 85.3 kg (188 lb) (05/02 1331)  Intake/Output from previous day: 05/02 0701 - 05/03 0700 In: 1470 [P.O.:120; I.V.:1200; IV Piggyback:100] Out: 575 [Urine:375; Blood:200] Intake/Output this shift: No intake/output data recorded.  Physical Exam: Strength full both legs.  Dressing CDI.  Lab Results: No results for input(s): WBC, HGB, HCT, PLT in the last 72 hours. BMET No results for input(s): NA, K, CL, CO2, GLUCOSE, BUN, CREATININE, CALCIUM in the last 72 hours.  Studies/Results: Dg Lumbar Spine 2-3 Views  Result Date: 06/03/2017 CLINICAL DATA:  L4-5 fusion EXAM: LUMBAR SPINE - 2-3 VIEW; DG C-ARM 61-120 MIN COMPARISON:  None. FINDINGS: Placement of posterior pedicle screws at L4-5. No hardware bony complicating feature. Slight anterolisthesis of L4 on L5. IMPRESSION: Pedicle screw placement at L4-5.  No complicating feature. Electronically Signed   By: Charlett Nose M.D.   On: 06/03/2017 10:23   Dg C-arm 1-60 Min  Result Date: 06/03/2017 CLINICAL DATA:  L4-5 fusion EXAM: LUMBAR SPINE - 2-3 VIEW; DG C-ARM 61-120 MIN COMPARISON:  None. FINDINGS: Placement of posterior pedicle screws at L4-5. No hardware bony complicating feature. Slight anterolisthesis of L4 on L5. IMPRESSION: Pedicle screw placement at L4-5.  No complicating feature. Electronically Signed   By: Charlett Nose M.D.   On: 06/03/2017 10:23    Assessment/Plan: Mobilizing with PT.  Observe and work on pain management today.  Anticipate D/C in am.    LOS: 1 day    Dorian Heckle, MD 06/04/2017, 7:42 AM

## 2017-06-04 NOTE — Evaluation (Signed)
Physical Therapy Evaluation Patient Details Name: Jack Huynh MRN: [id number] DOB: 1970-05-10 Today's Date: 06/04/2017   History of Present Illness  Pt is a 47 y/o male s/p PLIF L4-L5. PMH including but not limited to "vertebral fx in 4th grade treated with rest".  Clinical Impression  Pt presented sitting EOB, awake and willing to participate in therapy session. Pt's wife was present throughout session. Prior to admission, pt reported that he was independent with all functional mobility and ADLs. Pt will have 24/7 supervision/assistance upon d/c if necessary. Pt currently requires supervision for transfers and supervision for ambulation without use of an AD. Pt successfully completed stair training this session as well with min guard for safety and use of one hand rail. PT provided pt with back precautions handout and reviewed with pt and spouse. PT discussed a generalized walking program with pt and pt's spouse to be initiated upon d/c home. PT will continue to follow acutely to ensure a safe d/c home.    Follow Up Recommendations No PT follow up;Supervision - Intermittent    Equipment Recommendations  None recommended by PT;Other (comment)(?cane, pt requesting to practice at next session)    Recommendations for Other Services       Precautions / Restrictions Precautions Precautions: Back Precaution Booklet Issued: Yes (comment) Precaution Comments: PT provided pt with back precautions handout and reviewed 3/3 precautions with pt and spouse Required Braces or Orthoses: Spinal Brace Spinal Brace: Lumbar corset;Applied in sitting position Restrictions Weight Bearing Restrictions: No      Mobility  Bed Mobility               General bed mobility comments: pt sitting EOB upon arrival; discussed log roll technique with pt's wife demonstrating how they have been practicing  Transfers Overall transfer level: Needs assistance Equipment used: None Transfers: Sit to/from  Stand Sit to Stand: Supervision         General transfer comment: supervision for safety  Ambulation/Gait Ambulation/Gait assistance: Supervision Ambulation Distance (Feet): 300 Feet Assistive device: None Gait Pattern/deviations: Step-through pattern;Decreased stride length Gait velocity: decreased Gait velocity interpretation: 1.31 - 2.62 ft/sec, indicative of limited community ambulator General Gait Details: pt required standing rest breaks x3 secondary to pain; pt steady, cautious and guarded; no instability or LOB, supervision for safety; no AD used  Stairs Stairs: Yes Stairs assistance: Min guard Stair Management: One rail Left;Alternating pattern;Forwards Number of Stairs: 10 General stair comments: no instability or LOB, pt cautious and guarded, min guard for safety  Wheelchair Mobility    Modified Rankin (Stroke Patients Only)       Balance Overall balance assessment: Needs assistance Sitting-balance support: Feet supported Sitting balance-Leahy Scale: Good     Standing balance support: During functional activity;No upper extremity supported Standing balance-Leahy Scale: Fair                               Pertinent Vitals/Pain Pain Assessment: 0-10 Pain Score: 6  Pain Location: back Pain Descriptors / Indicators: Sore;Grimacing;Guarding Pain Intervention(s): Monitored during session;Premedicated before session;Repositioned;Patient requesting pain meds-RN notified    Home Living Family/patient expects to be discharged to:: Private residence Living Arrangements: Spouse/significant other;Children Available Help at Discharge: Family;Available 24 hours/day Type of Home: House Home Access: Stairs to enter Entrance Stairs-Rails: None Entrance Stairs-Number of Steps: 3 Home Layout: One level Home Equipment: None      Prior Function Level of Independence: Independent  Comments: very active, runner     Hand Dominance         Extremity/Trunk Assessment   Upper Extremity Assessment Upper Extremity Assessment: Defer to OT evaluation    Lower Extremity Assessment Lower Extremity Assessment: Overall WFL for tasks assessed    Cervical / Trunk Assessment Cervical / Trunk Assessment: Other exceptions Cervical / Trunk Exceptions: s/p lumbar sx  Communication   Communication: No difficulties  Cognition Arousal/Alertness: Awake/alert Behavior During Therapy: WFL for tasks assessed/performed Overall Cognitive Status: Within Functional Limits for tasks assessed                                        General Comments      Exercises     Assessment/Plan    PT Assessment Patient needs continued PT services  PT Problem List Decreased activity tolerance;Decreased balance;Decreased mobility;Decreased coordination;Decreased safety awareness;Decreased knowledge of precautions;Pain       PT Treatment Interventions DME instruction;Gait training;Stair training;Functional mobility training;Therapeutic activities;Therapeutic exercise;Balance training;Neuromuscular re-education;Patient/family education    PT Goals (Current goals can be found in the Care Plan section)  Acute Rehab PT Goals Patient Stated Goal: decrease pain; qualify for the [facility] PT Goal Formulation: With patient/family Time For Goal Achievement: 06/18/17 Potential to Achieve Goals: Good    Frequency Min 5X/week   Barriers to discharge        Co-evaluation               AM-PAC PT "6 Clicks" Daily Activity  Outcome Measure Difficulty turning over in bed (including adjusting bedclothes, sheets and blankets)?: A Little Difficulty moving from lying on back to sitting on the side of the bed? : A Little Difficulty sitting down on and standing up from a chair with arms (e.g., wheelchair, bedside commode, etc,.)?: A Little Help needed moving to and from a bed to chair (including a wheelchair)?: None Help needed  walking in hospital room?: None Help needed climbing 3-5 steps with a railing? : A Little 6 Click Score: 20    End of Session Equipment Utilized During Treatment: Back brace Activity Tolerance: Patient limited by pain Patient left: in chair;with call bell/phone within reach;with family/visitor present Nurse Communication: Mobility status PT Visit Diagnosis: Other abnormalities of gait and mobility (R26.89);Pain Pain - part of body: (back)    Time: 9604-5409 PT Time Calculation (min) (ACUTE ONLY): 25 min   Charges:   PT Evaluation $PT Eval Low Complexity: 1 Low PT Treatments $Gait Training: 8-22 mins   PT G Codes:        [place], PT, [place] [phone]   Alessandra Bevels Nayana Lenig 06/04/2017, 9:33 AM

## 2017-06-04 NOTE — Evaluation (Signed)
Occupational Therapy Evaluation and Discharge Patient Details Name: Jack Huynh MRN: [id number] DOB: 1970-12-31 Today's Date: 06/04/2017    History of Present Illness Pt is a 47 y/o male s/p PLIF L4-L5. PMH including but not limited to "vertebral fx in 4th grade treated with rest".   Clinical Impression   This 47 yo male admitted and underwent above presents to acute OT with all education completed with pt and wife, we will D/C from acute OT.    Follow Up Recommendations  No OT follow up;Supervision - Intermittent    Equipment Recommendations  Other (comment)(SPC)       Precautions / Restrictions Precautions Precautions: Back Precaution Booklet Issued: Yes (comment) Required Braces or Orthoses: Spinal Brace Spinal Brace: Lumbar corset;Applied in sitting position Restrictions Weight Bearing Restrictions: No      Mobility Bed Mobility               General bed mobility comments: pt sitting EOB upon arrival  Transfers Overall transfer level: Needs assistance Equipment used: Straight cane Transfers: Sit to/from Stand Sit to Stand: Modified independent (Device/Increase time)                  ADL either performed or assessed with clinical judgement   ADL                                         General ADL Comments: Educated pt and wife on sequence of getting dressed and how wife will need to assist him in the short run for LBD, using 2 cups for brushing teeth, using baby wipes for back peri care, stance for sit to stand when you do not have arms on furniture to help stand up, how to safely get into and out of car/truck/SUV, sequence of doing stairs, amount of time he needs to sit at one time     Vision Patient Visual Report: No change from baseline              Pertinent Vitals/Pain Pain Assessment: 0-10 Pain Score: 6  Pain Location: back Pain Descriptors / Indicators: Sore Pain Intervention(s): Monitored during  session;Premedicated before session     Hand Dominance Right   Extremity/Trunk Assessment Upper Extremity Assessment Upper Extremity Assessment: Overall WFL for tasks assessed           Communication Communication Communication: No difficulties   Cognition Arousal/Alertness: Awake/alert Behavior During Therapy: WFL for tasks assessed/performed Overall Cognitive Status: Within Functional Limits for tasks assessed                                                Home Living Family/patient expects to be discharged to:: Private residence Living Arrangements: Spouse/significant other;Children Available Help at Discharge: Family;Available 24 hours/day Type of Home: House Home Access: Stairs to enter [employer] of Steps: 3 Entrance Stairs-Rails: None Home Layout: One level     Bathroom Shower/Tub: Producer, television/film/video: Standard     Home Equipment: None          Prior Functioning/Environment Level of Independence: Independent        Comments: very active, runner        OT Problem List: Decreased range of motion;Pain  OT Goals(Current goals can be found in the care plan section) Acute Rehab OT Goals Patient Stated Goal: Home tomorrow  OT Frequency:                AM-PAC PT "6 Clicks" Daily Activity     Outcome Measure Help from another person eating meals?: None Help from another person taking care of personal grooming?: None Help from another person toileting, which includes using toliet, bedpan, or urinal?: None Help from another person bathing (including washing, rinsing, drying)?: None Help from another person to put on and taking off regular upper body clothing?: None Help from another person to put on and taking off regular lower body clothing?: None 6 Click Score: 24   End of Session Equipment Utilized During Treatment: Back brace(SPC) Nurse Communication: (pt does need a SPC for  home)  Activity Tolerance: Patient tolerated treatment well Patient left: (walking in hallway with wife with [facility])  OT Visit Diagnosis: Pain Pain - part of body: (back)                Time: 1337-1410 OT Time Calculation (min): 33 min Charges:  OT General Charges $OT Visit: 1 Visit OT Evaluation $OT Eval Moderate Complexity: 1 Mod OT Treatments $Self Care/Home Management : 8-22 mins Ignacia Palma, OTR/L [phone] 06/04/2017

## 2017-06-04 NOTE — Progress Notes (Signed)
Preliminary notes --Bilateral lower extremities venous duplex study completed. Negative for DVT.     Hongying Tamaka Sawin (RDMS RVT) 06/04/17 1:32 PM

## 2017-06-05 MED ORDER — MAGNESIUM CITRATE PO SOLN
1.0000 | Freq: Once | ORAL | Status: AC
  Administered 2017-06-05: 1 via ORAL
  Filled 2017-06-05: qty 296

## 2017-06-05 MED ORDER — METHOCARBAMOL 500 MG PO TABS: 500.0000 mg | ORAL_TABLET | Freq: Four times a day (QID) | ORAL | 3 refills | Status: AC | PRN

## 2017-06-05 MED ORDER — OXYCODONE HCL 10 MG PO TABS: 10.0000 mg | ORAL_TABLET | ORAL | 0 refills | Status: AC | PRN

## 2017-06-05 NOTE — Progress Notes (Signed)
Physical Therapy Treatment and DISCHARGE Patient Details Name: Jack Huynh MRN: [id number] DOB: March 12, 1970 Today's Date: 06/05/2017    History of Present Illness Pt is a 47 y/o male s/p PLIF L4-L5. PMH including but not limited to "vertebral fx in 4th grade treated with rest".    PT Comments    Patient seen for mobility progression s/p spinal surgery. Mobilizing well. Educated patient on precautions, mobility expectations, safety and car transfers. No further acute PT needs. Will sign off.    Follow Up Recommendations  No PT follow up;Supervision - Intermittent     Equipment Recommendations  None recommended by PT;Other (comment)(?cane, pt requesting to practice at next session)    Recommendations for Other Services       Precautions / Restrictions Precautions Precautions: Back Precaution Booklet Issued: Yes (comment) Precaution Comments: PT provided pt with back precautions handout and reviewed 3/3 precautions with pt and spouse Required Braces or Orthoses: Spinal Brace Spinal Brace: Lumbar corset;Applied in sitting position Restrictions Weight Bearing Restrictions: No    Mobility  Bed Mobility               General bed mobility comments: pt sitting EOB upon arrival  Transfers Overall transfer level: Needs assistance Equipment used: Straight cane Transfers: Sit to/from Stand Sit to Stand: Modified independent (Device/Increase time)         General transfer comment: supervision for safety  Ambulation/Gait Ambulation/Gait assistance: Modified independent (Device/Increase time) Ambulation Distance (Feet): 340 Feet Assistive device: None Gait Pattern/deviations: Step-through pattern;Decreased stride length Gait velocity: decreased Gait velocity interpretation: <1.8 ft/sec, indicate of risk for recurrent falls General Gait Details: good technique   Stairs Stairs: Yes Stairs assistance: Min guard Stair Management: One rail Left;Alternating  pattern;Forwards Number of Stairs: 10 General stair comments: no instability or LOB, pt cautious and guarded, min guard for safety   Wheelchair Mobility    Modified Rankin (Stroke Patients Only)       Balance Overall balance assessment: Needs assistance Sitting-balance support: Feet supported Sitting balance-Leahy Scale: Good     Standing balance support: During functional activity;No upper extremity supported Standing balance-Leahy Scale: Fair                              Cognition Arousal/Alertness: Awake/alert Behavior During Therapy: WFL for tasks assessed/performed Overall Cognitive Status: Within Functional Limits for tasks assessed                                        Exercises      General Comments        Pertinent Vitals/Pain Pain Assessment: 0-10 Pain Score: 4  Pain Location: back Pain Descriptors / Indicators: Sore Pain Intervention(s): Monitored during session    Home Living                      Prior Function            PT Goals (current goals can now be found in the care plan section) Acute Rehab PT Goals Patient Stated Goal: Home tomorrow PT Goal Formulation: With patient/family Time For Goal Achievement: 06/18/17 Potential to Achieve Goals: Good Progress towards PT goals: Progressing toward goals    Frequency    Min 5X/week      PT Plan Current plan remains appropriate    Co-evaluation  AM-PAC PT "6 Clicks" Daily Activity  Outcome Measure  Difficulty turning over in bed (including adjusting bedclothes, sheets and blankets)?: A Little Difficulty moving from lying on back to sitting on the side of the bed? : A Little Difficulty sitting down on and standing up from a chair with arms (e.g., wheelchair, bedside commode, etc,.)?: A Little Help needed moving to and from a bed to chair (including a wheelchair)?: None Help needed walking in hospital room?: None Help needed  climbing 3-5 steps with a railing? : A Little 6 Click Score: 20    End of Session Equipment Utilized During Treatment: Back brace Activity Tolerance: Patient limited by pain Patient left: in chair;with call bell/phone within reach;with family/visitor present Nurse Communication: Mobility status PT Visit Diagnosis: Other abnormalities of gait and mobility (R26.89);Pain Pain - part of body: (back)     Time: 1610-9604 PT Time Calculation (min) (ACUTE ONLY): 15 min  Charges:  $Gait Training: 8-22 mins                    G Codes:       Charlotte Crumb, PT DPT  Board Certified Neurologic Specialist [phone]    Fabio Asa 06/05/2017, 7:07 AM

## 2017-06-05 NOTE — Discharge Instructions (Signed)

## 2017-06-05 NOTE — Progress Notes (Signed)
Patient is discharged from room 3C03 at this time. Alert and in stable condition. IV site d/c'd and instructions read to patient and spouse with understanding verbalized. Patient and spouse had a lot of questions which were answered. Spouse concerned about patient's rt lower leg. Nurse explained to them again that the doppler result was negative and that MD told them on his rounding this morning to call the office if the leg gets more swollen, red or painful. Left unit via wheelchair with all belongings at side.

## 2017-06-05 NOTE — Discharge Summary (Signed)
Physician Discharge Summary  Patient ID: Jack Huynh MRN: [id number] DOB/AGE: 46-Oct-1972 46 y.o.  Admit date: 06/03/2017 Discharge date: 06/05/2017  Admission Diagnoses: Spondylolisthesis with radiculopathy L4-L5  Discharge Diagnoses: Spondylolisthesis with radiculopathy L4-L5 Active Problems:   Spondylolysis of lumbar region   Discharged Condition: good  Hospital Course: Patient tolerated surgery well he is ambulatory his incision is clean and dry  Consults: None  Significant Diagnostic Studies: None  Treatments: surgery: Laminectomy L4-5 with decompression of L4 and L5 nerve roots posterior lumbar interbody arthrodesis with peek spacers pedicle screw fixation posterior lateral arthrodesis  Discharge Exam: Blood pressure 109/74, pulse 86, temperature 98.6 F (37 C), temperature source Oral, resp. rate 18, height 6' (1.829 m), weight 85.3 kg (188 lb), SpO2 99 %. Incision is clean and dry Station and gait are intact  Disposition: Discharge disposition: 01-Home or Self Care       Discharge Instructions    Call MD for:  redness, tenderness, or signs of infection (pain, swelling, redness, odor or green/yellow discharge around incision site)   Complete by:  As directed    Call MD for:  severe uncontrolled pain   Complete by:  As directed    Call MD for:  temperature >100.4   Complete by:  As directed    Diet - low sodium heart healthy   Complete by:  As directed    Increase activity slowly   Complete by:  As directed      Allergies as of 06/05/2017   No Known Allergies     Medication List    TAKE these medications   methocarbamol 500 MG tablet Commonly known as:  ROBAXIN Take 1 tablet (500 mg total) by mouth every 6 (six) hours as needed for muscle spasms.   Oxycodone HCl 10 MG Tabs Take 1 tablet (10 mg total) by mouth every 3 (three) hours as needed for moderate pain or severe pain.            Durable Medical Equipment  (From admission, onward)         Start     Ordered   06/04/17 1705  For home use only DME Cane  Once     06/04/17 1704       Signed: Barnett Abu J 06/05/2017, 7:59 AM

## 2019-10-17 IMAGING — MR MR LUMBAR SPINE W/O CM
4 of 5 series · 18 of 48 positions shown · non-contrast
Comparison: Lumbar radiographs 04/21/2017.  Lumbar MRI 05/22/2015

CLINICAL DATA: 47-year-old male with chronic central lumbar back
pain. Remote football injury. Progressed symptoms beginning about 2
years ago while running a race. Left lower extremity numbness and
tightness.

EXAM:
MRI LUMBAR SPINE WITHOUT CONTRAST
TECHNIQUE: Multiplanar, multisequence MR imaging of the lumbar spine was
performed. No intravenous contrast was administered.

[Series 6: T2 · sagittal · 4.0mm · 0.73mm/px · 6 of 17 slices shown (1 of 2)]
[im 1/17]
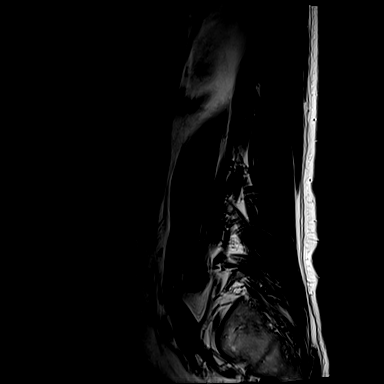
[im 4/17]
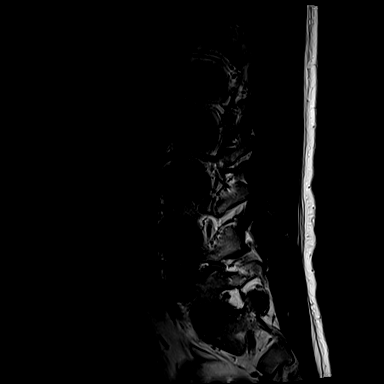
[im 7/17]
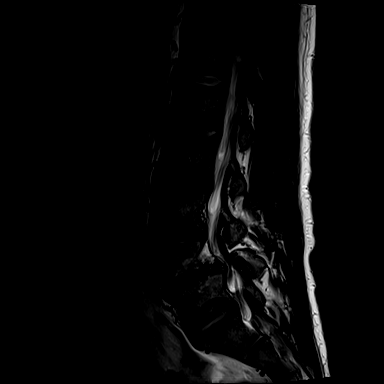
[im 10/17]
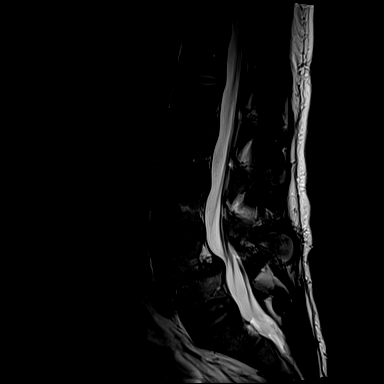
[im 13/17]
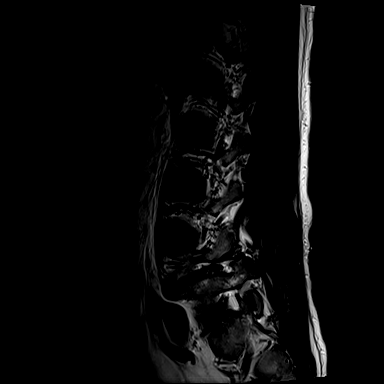
[im 17/17]
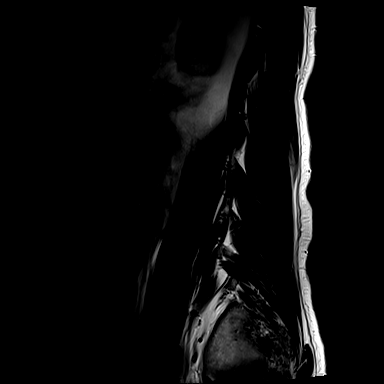

[Series 8: T1 · sagittal · 4.0mm · 0.73mm/px · 3 of 17 slices shown (1 of 2)]
[im 4/17]
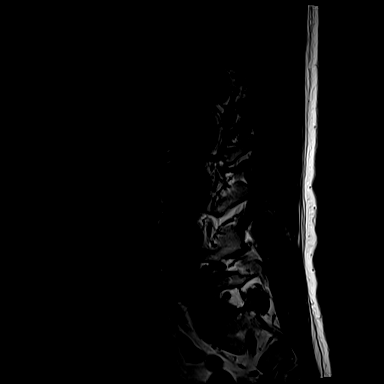
[im 10/17]
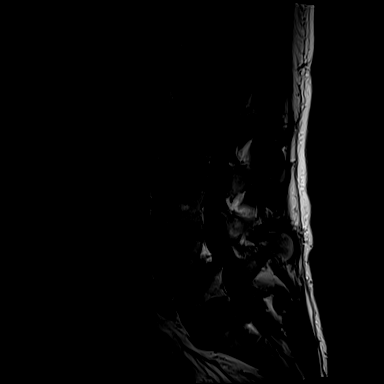
[im 17/17]
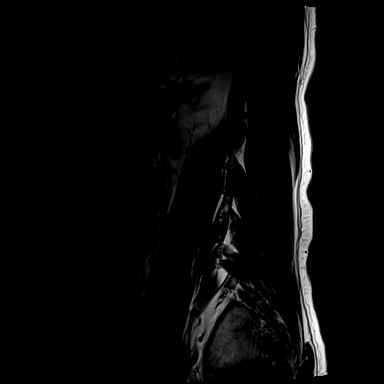

[Series 11: T1 · axial · 4.0mm · 0.28mm/px · z∈[-43,+126]mm · 3 of 43 slices shown (2 of 2)]
[im 7/43]
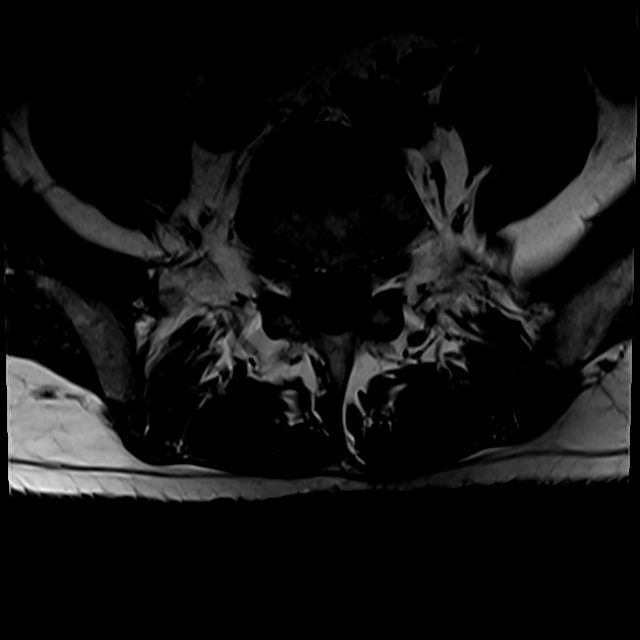
[im 22/43]
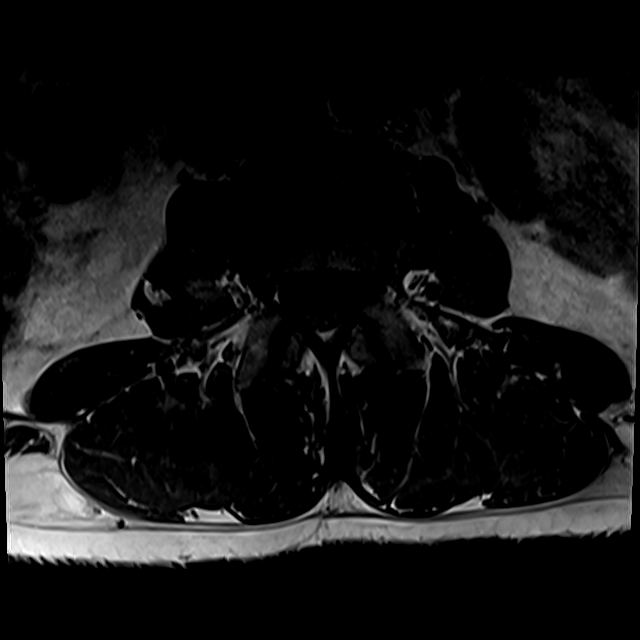
[im 37/43]
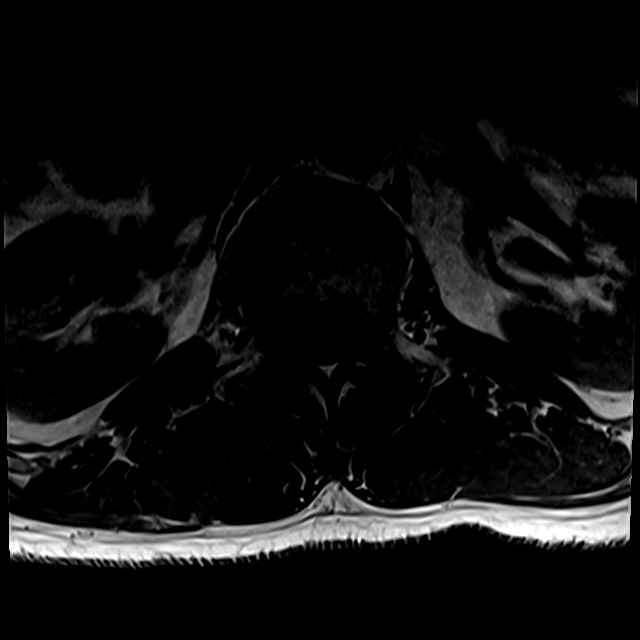

[Series 14: T2 · axial · 4.0mm · 0.28mm/px · z∈[-72,+126]mm · 6 of 43 slices shown (2 of 2)]
[im 1/43]
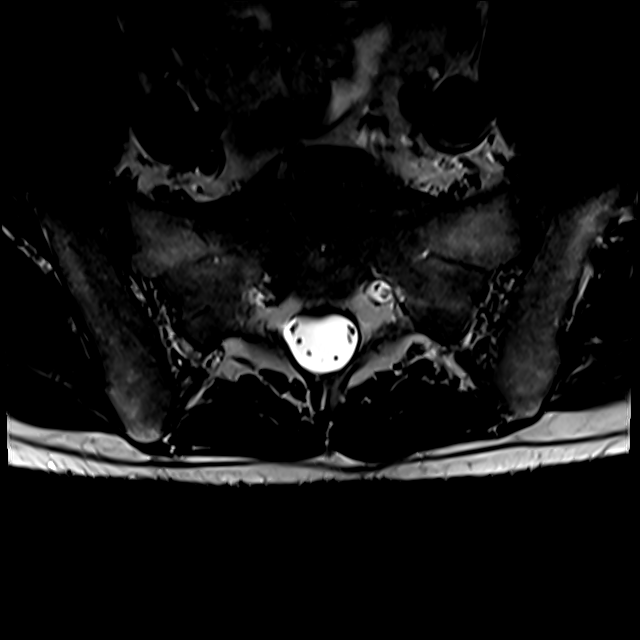
[im 7/43]
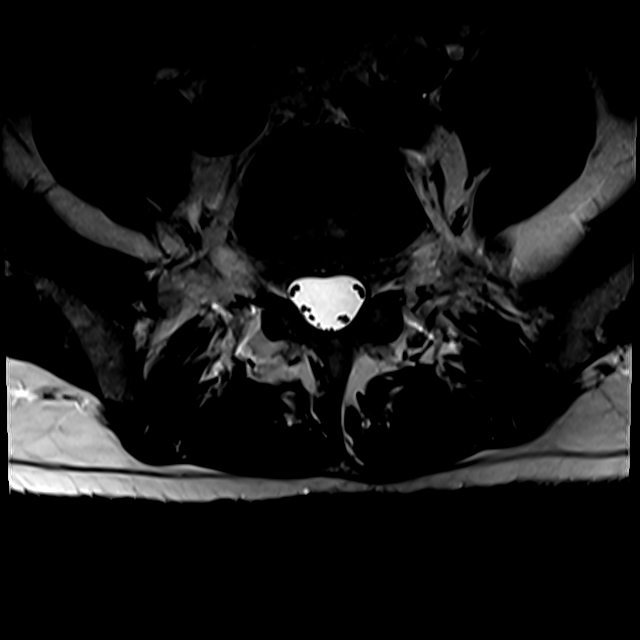
[im 13/43]
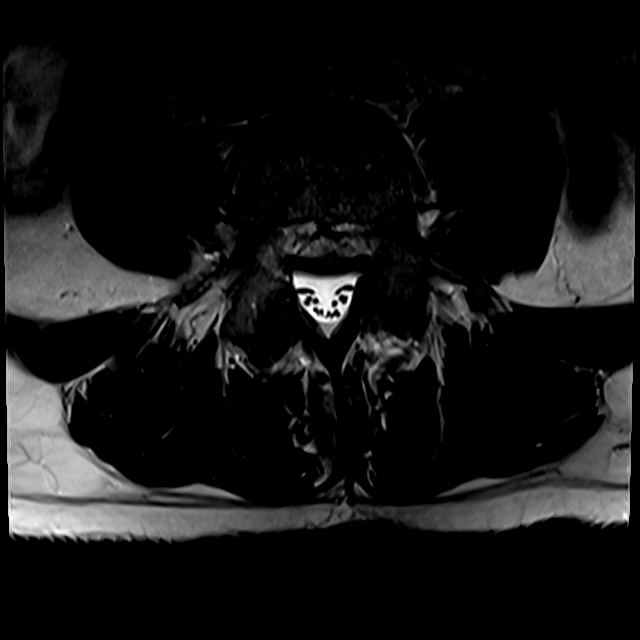
[im 19/43]
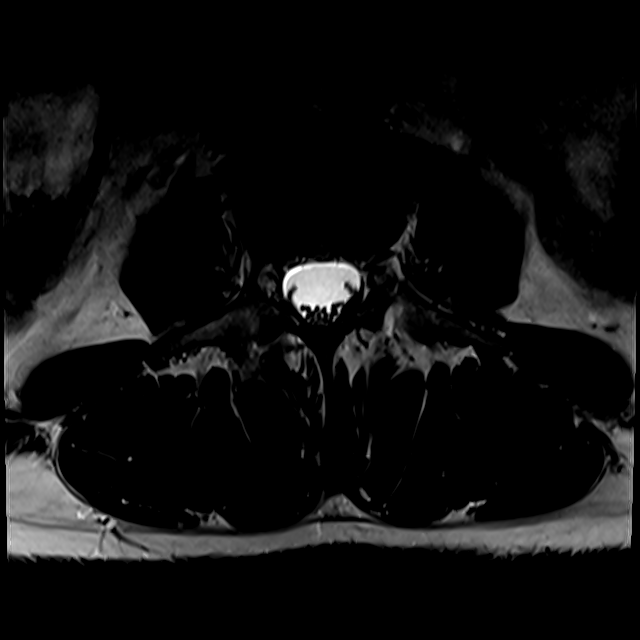
[im 22/43]
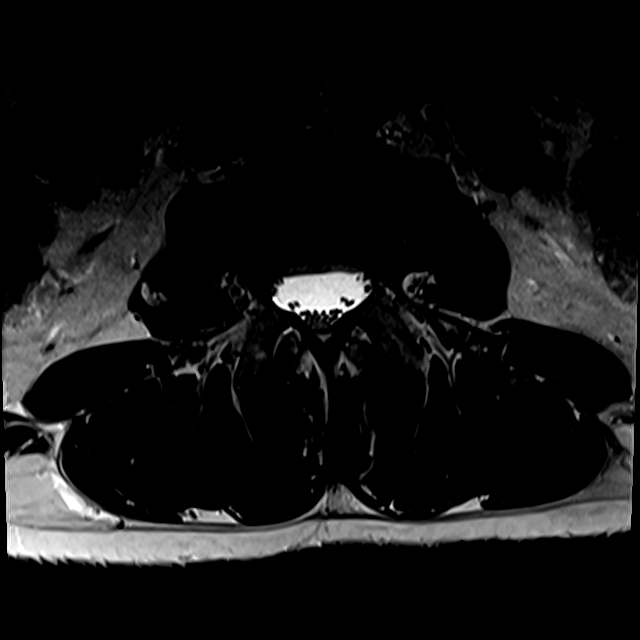
[im 37/43]
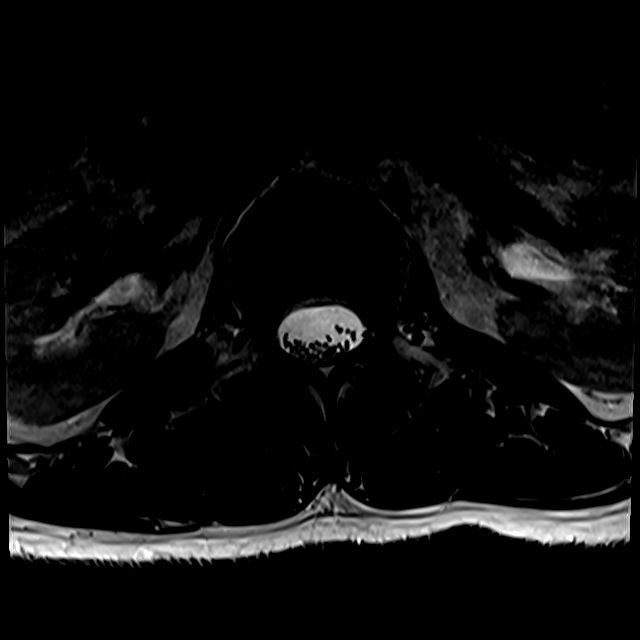

[18 of 48 positions shown; findings below may reference images not displayed]

FINDINGS: Segmentation: Normal on the comparison radiographs which is the same
numbering system used on the 9285 MRI.

Alignment: Chronic grade 2 anterolisthesis of L5 on S1, measures
14-15 millimeters today versus 13-14 millimeters in 9285. Chronic L4
pars fractures suspected. Stable vertebral height and alignment
elsewhere, normal aside from mild straightening of lordosis.

Vertebrae: Chronic but increased degenerative vertebral marrow edema
in the posterior inferior L4, and throughout the superior L5
vertebral bodies. See additional details below. Background bone
marrow signal is normal. No other acute osseous abnormality
identified. Intact visible sacrum and SI joints.

Conus medullaris and cauda equina: Conus extends to the T12 level.
Conus and cauda equina appear normal.

Paraspinal and other soft tissues: Negative.

Disc levels:

Normal disc signal and morphology in the lower thoracic spine and at
L1-L2.

L2-L3: Mild disc desiccation and minimal disc bulging. No stenosis.

L3-L4: Mild disc desiccation. Minimal disc bulging and endplate
spurring. Mild facet hypertrophy. No stenosis.

L4-L5: Severe chronic disc space loss in the setting of chronic
grade 2 spondylolisthesis. Broad-based posterior disc/pseudo disc.
Chronic pars fractures with moderate facet hypertrophy. No
significant spinal stenosis. No convincing lateral recess stenosis.
But there is severe bilateral L4 foraminal stenosis which may be
greater on the left.

L5-S1:  Borderline to mild facet hypertrophy.  No stenosis.
IMPRESSION: 1. Chronic L4-L5 spondylolysis and grade 2 spondylolisthesis. Mild
progression of the anterior slip since 9285 and increased now
moderate degenerative L4 and L5 vertebral body marrow edema. No
significant spinal stenosis, but severe bilateral L4 foraminal
stenosis.
2. Minimal lumbar spine degeneration otherwise.
